# Patient Record
Sex: Male | Born: 1970 | Race: White | Hispanic: No | Marital: Married | Smoking: Never smoker
Health system: Southern US, Community
[De-identification: ages and names within clinical notes are randomized; demographics above are authoritative.]

## PROBLEM LIST (undated history)

## (undated) DIAGNOSIS — I10 Essential (primary) hypertension: Secondary | ICD-10-CM

## (undated) HISTORY — PX: NOSE SURGERY: SHX723

## (undated) HISTORY — PX: NASAL SEPTOPLASTY W/ TURBINOPLASTY: SHX2070

## (undated) HISTORY — PX: BACK SURGERY: SHX140

## (undated) HISTORY — PX: LUMBAR LAMINECTOMY: SHX95

## (undated) NOTE — *Deleted (*Deleted)
Initial Nutrition Assessment  DOCUMENTATION CODES:      INTERVENTION:     NUTRITION DIAGNOSIS:     related to   as evidenced by  .    GOAL:        MONITOR:      REASON FOR ASSESSMENT:   Malnutrition Screening Tool    ASSESSMENT:  5 year old male with history of DM2, HTN, CKD stage IIIa, diagnosed with Covid on 9/30 who presents with shortness of breath,     ***   NUTRITION - FOCUSED PHYSICAL EXAM:  {RD Focused Exam List:21252}  Diet Order:   Diet Order            Diet heart healthy/carb modified Room service appropriate? Yes; Fluid consistency: Thin  Diet effective now                 EDUCATION NEEDS:      Skin:     Last BM:     Height:   Ht Readings from Last 1 Encounters:  06/28/20 5\' 11"  (1.803 m)    Weight:   Wt Readings from Last 1 Encounters:  06/28/20 105 kg    Ideal Body Weight:     BMI:  Body mass index is 32.27 kg/m.  Estimated Nutritional Needs:   Kcal:     Protein:     Fluid:       ***

---

## 2007-01-01 ENCOUNTER — Ambulatory Visit: Payer: Self-pay | Admitting: Otolaryngology

## 2007-01-10 ENCOUNTER — Ambulatory Visit: Payer: Self-pay | Admitting: Otolaryngology

## 2007-05-07 ENCOUNTER — Emergency Department: Payer: Self-pay | Admitting: Emergency Medicine

## 2013-10-21 ENCOUNTER — Ambulatory Visit: Payer: Self-pay

## 2014-01-30 ENCOUNTER — Emergency Department: Payer: Self-pay | Admitting: Emergency Medicine

## 2014-01-30 LAB — URINALYSIS, COMPLETE
BILIRUBIN, UR: NEGATIVE
Bacteria: NONE SEEN
Glucose,UR: NEGATIVE mg/dL (ref 0–75)
KETONE: NEGATIVE
Leukocyte Esterase: NEGATIVE
Nitrite: NEGATIVE
Ph: 5 (ref 4.5–8.0)
Protein: 100
RBC,UR: 24 /HPF (ref 0–5)
SQUAMOUS EPITHELIAL: NONE SEEN
Specific Gravity: 1.027 (ref 1.003–1.030)
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
## 2015-11-17 ENCOUNTER — Other Ambulatory Visit: Payer: Self-pay | Admitting: Orthopedic Surgery

## 2015-11-17 DIAGNOSIS — M5441 Lumbago with sciatica, right side: Secondary | ICD-10-CM

## 2015-11-27 ENCOUNTER — Ambulatory Visit: Payer: Self-pay

## 2015-12-09 ENCOUNTER — Ambulatory Visit
Admission: RE | Admit: 2015-12-09 | Discharge: 2015-12-09 | Disposition: A | Discharge status: Home / Self Care | Payer: Managed Care, Other (non HMO) | Source: Ambulatory Visit | Attending: Orthopedic Surgery | Admitting: Orthopedic Surgery

## 2015-12-09 DIAGNOSIS — M5126 Other intervertebral disc displacement, lumbar region: Secondary | ICD-10-CM | POA: Diagnosis not present

## 2015-12-09 DIAGNOSIS — M2578 Osteophyte, vertebrae: Secondary | ICD-10-CM | POA: Insufficient documentation

## 2015-12-09 DIAGNOSIS — M4806 Spinal stenosis, lumbar region: Secondary | ICD-10-CM | POA: Insufficient documentation

## 2015-12-09 DIAGNOSIS — M5441 Lumbago with sciatica, right side: Secondary | ICD-10-CM | POA: Diagnosis not present

## 2015-12-09 DIAGNOSIS — M5137 Other intervertebral disc degeneration, lumbosacral region: Secondary | ICD-10-CM | POA: Insufficient documentation

## 2015-12-11 ENCOUNTER — Ambulatory Visit: Payer: Self-pay

## 2015-12-21 ENCOUNTER — Other Ambulatory Visit: Payer: Self-pay | Admitting: Orthopedic Surgery

## 2015-12-21 DIAGNOSIS — M5441 Lumbago with sciatica, right side: Secondary | ICD-10-CM

## 2015-12-21 DIAGNOSIS — M5126 Other intervertebral disc displacement, lumbar region: Secondary | ICD-10-CM

## 2015-12-23 ENCOUNTER — Ambulatory Visit
Admission: RE | Admit: 2015-12-23 | Discharge: 2015-12-23 | Disposition: A | Discharge status: Home / Self Care | Payer: Managed Care, Other (non HMO) | Source: Ambulatory Visit | Attending: Orthopedic Surgery | Admitting: Orthopedic Surgery

## 2015-12-23 ENCOUNTER — Other Ambulatory Visit: Payer: Self-pay | Admitting: Orthopedic Surgery

## 2015-12-23 ENCOUNTER — Other Ambulatory Visit: Payer: Self-pay

## 2015-12-23 DIAGNOSIS — M5126 Other intervertebral disc displacement, lumbar region: Secondary | ICD-10-CM

## 2015-12-23 DIAGNOSIS — I10 Essential (primary) hypertension: Secondary | ICD-10-CM | POA: Insufficient documentation

## 2015-12-23 DIAGNOSIS — J309 Allergic rhinitis, unspecified: Secondary | ICD-10-CM | POA: Insufficient documentation

## 2015-12-23 DIAGNOSIS — M5441 Lumbago with sciatica, right side: Secondary | ICD-10-CM

## 2015-12-23 MED ORDER — IOHEXOL 180 MG/ML  SOLN
1.0000 mL | Freq: Once | INTRAMUSCULAR | Status: AC | PRN
  Administered 2015-12-23: 1 mL via EPIDURAL

## 2015-12-23 MED ORDER — METHYLPREDNISOLONE ACETATE 40 MG/ML INJ SUSP (RADIOLOG
120.0000 mg | Freq: Once | INTRAMUSCULAR | Status: AC
  Administered 2015-12-23: 120 mg via EPIDURAL

## 2015-12-23 NOTE — Discharge Instructions (Signed)

## 2016-01-19 ENCOUNTER — Other Ambulatory Visit: Payer: Self-pay | Admitting: Orthopedic Surgery

## 2016-01-19 DIAGNOSIS — M545 Low back pain: Principal | ICD-10-CM

## 2016-01-19 DIAGNOSIS — G8929 Other chronic pain: Secondary | ICD-10-CM

## 2016-01-20 ENCOUNTER — Other Ambulatory Visit: Payer: Self-pay | Admitting: Orthopedic Surgery

## 2016-01-20 ENCOUNTER — Ambulatory Visit
Admission: RE | Admit: 2016-01-20 | Discharge: 2016-01-20 | Disposition: A | Discharge status: Home / Self Care | Payer: BLUE CROSS/BLUE SHIELD | Source: Ambulatory Visit | Attending: Orthopedic Surgery | Admitting: Orthopedic Surgery

## 2016-01-20 DIAGNOSIS — M545 Low back pain, unspecified: Secondary | ICD-10-CM

## 2016-01-20 DIAGNOSIS — G8929 Other chronic pain: Secondary | ICD-10-CM

## 2016-01-20 MED ORDER — IOPAMIDOL (ISOVUE-M 200) INJECTION 41%
1.0000 mL | Freq: Once | INTRAMUSCULAR | Status: AC
  Administered 2016-01-20: 1 mL via EPIDURAL

## 2016-01-20 MED ORDER — METHYLPREDNISOLONE ACETATE 40 MG/ML INJ SUSP (RADIOLOG
120.0000 mg | Freq: Once | INTRAMUSCULAR | Status: AC
  Administered 2016-01-20: 120 mg via EPIDURAL

## 2018-02-28 ENCOUNTER — Encounter: Payer: Self-pay | Admitting: *Deleted

## 2018-02-28 ENCOUNTER — Emergency Department
Admission: EM | Admit: 2018-02-28 | Discharge: 2018-02-28 | Disposition: A | Discharge status: Home / Self Care | Payer: BLUE CROSS/BLUE SHIELD | Attending: Emergency Medicine | Admitting: Emergency Medicine

## 2018-02-28 ENCOUNTER — Other Ambulatory Visit: Payer: Self-pay

## 2018-02-28 ENCOUNTER — Emergency Department: Payer: BLUE CROSS/BLUE SHIELD

## 2018-02-28 ENCOUNTER — Ambulatory Visit
Admission: EM | Admit: 2018-02-28 | Discharge: 2018-02-28 | Disposition: A | Discharge status: Other Acute Inpatient Hospital | Payer: BLUE CROSS/BLUE SHIELD | Source: Home / Self Care

## 2018-02-28 DIAGNOSIS — M5489 Other dorsalgia: Secondary | ICD-10-CM | POA: Diagnosis present

## 2018-02-28 DIAGNOSIS — I10 Essential (primary) hypertension: Secondary | ICD-10-CM | POA: Diagnosis not present

## 2018-02-28 DIAGNOSIS — R9431 Abnormal electrocardiogram [ECG] [EKG]: Secondary | ICD-10-CM | POA: Diagnosis not present

## 2018-02-28 DIAGNOSIS — R001 Bradycardia, unspecified: Secondary | ICD-10-CM | POA: Diagnosis not present

## 2018-02-28 DIAGNOSIS — Z79899 Other long term (current) drug therapy: Secondary | ICD-10-CM | POA: Diagnosis not present

## 2018-02-28 DIAGNOSIS — M549 Dorsalgia, unspecified: Secondary | ICD-10-CM | POA: Diagnosis not present

## 2018-02-28 DIAGNOSIS — M5413 Radiculopathy, cervicothoracic region: Secondary | ICD-10-CM | POA: Insufficient documentation

## 2018-02-28 MED ORDER — ASPIRIN 81 MG PO CHEW
324.0000 mg | CHEWABLE_TABLET | Freq: Once | ORAL | Status: AC
  Administered 2018-02-28: 324 mg via ORAL

## 2018-02-28 MED ORDER — NITROGLYCERIN 2 % TD OINT
1.0000 [in_us] | TOPICAL_OINTMENT | Freq: Once | TRANSDERMAL | Status: AC
  Administered 2018-02-28: 1 [in_us] via TOPICAL

## 2018-02-28 MED ORDER — PREDNISONE 20 MG PO TABS
60.0000 mg | ORAL_TABLET | Freq: Once | ORAL | Status: AC
  Administered 2018-02-28: 60 mg via ORAL
  Filled 2018-02-28: qty 3

## 2018-02-28 MED ORDER — CLONIDINE HCL 0.1 MG PO TABS
0.1000 mg | ORAL_TABLET | Freq: Once | ORAL | Status: AC
  Administered 2018-02-28: 0.1 mg via ORAL

## 2018-02-28 MED ORDER — PREDNISONE 20 MG PO TABS: 60.0000 mg | ORAL_TABLET | Freq: Every day | ORAL | 0 refills | Status: AC

## 2018-02-28 MED ORDER — ACETAMINOPHEN 500 MG PO TABS
1000.0000 mg | ORAL_TABLET | Freq: Once | ORAL | Status: AC
  Administered 2018-02-28: 1000 mg via ORAL
  Filled 2018-02-28: qty 2

## 2018-02-28 NOTE — ED Provider Notes (Signed)
[facility] Emergency Department Provider Note  ____________________________________________  Time seen: Approximately 6:18 PM  I have reviewed the triage vital signs and the nursing notes.   HISTORY  Chief Complaint Back Pain and Hypertension   HPI Miguel Blacksmitharon Eskew is a 47 y.o. male history of high blood pressure who presents for evaluation of back pain and right arm pain.  Patient reports that he woke up this morning with pain that starts on the right upper back and radiates down his right arm.  The pain is constant dull and nonradiating.  Moving his head up and down makes the pain worse, lifting his arm up makes the pain better.  He denies any trauma or lifting anything heavy yesterday.  Patient reports no weakness or numbness of his arm, no chest pain, no dizziness.  He reports that the pain initially was severe at this morning however throughout the day the pain improved.  Pain is currently 2 out of 10.  Patient also has a history of high blood pressure but has been taken off of his medications by his primary care doctor.  He reports that several physicians at the practice he used to go quit and he has not seen a doctor in 2 to 3 years.  Patient Active Problem List   Diagnosis Date Noted  . Allergic rhinitis 12/23/2015  . BP (high blood pressure) 12/23/2015    Past Surgical History:  Procedure Laterality Date  . LUMBAR LAMINECTOMY     multiple  . NOSE SURGERY      Prior to Admission medications   Medication Sig Start Date End Date Taking? Authorizing Provider  gabapentin (NEURONTIN) 300 MG capsule Take by mouth. 11/02/15 01/01/16  [provider]  predniSONE (DELTASONE) 20 MG tablet Take 3 tablets (60 mg total) by mouth daily for 4 days. 02/28/18 03/04/18  Nita SickleVeronese, [place], MD  tiZANidine (ZANAFLEX) 4 MG tablet Take by mouth. 10/26/15   [provider]  traMADol (ULTRAM) 50 MG tablet Take 50 mg by mouth every 6 (six) hours as needed.     [provider]    Allergies Patient has no known allergies.  FH Throat cancer Father    Diabetes type II Mother    Myocardial Infarction (Heart attack) Mother  age 55<60  Prostate cancer Paternal Uncle       Social History Social History   Tobacco Use  . Smoking status: Never Smoker  . Smokeless tobacco: Never Used  Substance Use Topics  . Alcohol use: Yes    Alcohol/week: 0.0 oz    Comment: rarely  . Drug use: Never    Review of Systems  Constitutional: Negative for fever. Eyes: Negative for visual changes. ENT: Negative for sore throat. Neck: No neck pain  Cardiovascular: Negative for chest pain. Respiratory: Negative for shortness of breath. Gastrointestinal: Negative for abdominal pain, vomiting or diarrhea. Genitourinary: Negative for dysuria. Musculoskeletal: + R upper back pain and R arm pain Skin: Negative for rash. Neurological: Negative for headaches, weakness or numbness. Psych: No SI or HI  ____________________________________________   PHYSICAL EXAM:  VITAL SIGNS: ED Triage Vitals  Enc Vitals Group     BP 02/28/18 1732 (!) 144/83     Pulse Rate 02/28/18 1732 68     Resp 02/28/18 1732 12     Temp 02/28/18 1732 97.8 F (36.6 C)     Temp Source 02/28/18 1732 Oral     SpO2 02/28/18 1732 100 %     Weight 02/28/18  1733 245 lb (111.1 kg)     Height 02/28/18 1733 5\' 11"  (1.803 m)     Head Circumference --      Peak Flow --      Pain Score 02/28/18 1733 8     Pain Loc --      Pain Edu? --      Excl. in GC? --     Constitutional: Alert and oriented. Well appearing and in no apparent distress. HEENT:      Head: Normocephalic and atraumatic.         Eyes: Conjunctivae are normal. Sclera is non-icteric.       Mouth/Throat: Mucous membranes are moist.       Neck: Supple with no signs of meningismus. Cardiovascular: Regular rate and rhythm. No murmurs, gallops, or rubs. 2+ symmetrical distal pulses are present in all extremities.  No JVD. Respiratory: Normal respiratory effort. Lungs are clear to auscultation bilaterally. No wheezes, crackles, or rhonchi.  Gastrointestinal: Soft, non tender, and non distended with positive bowel sounds. No rebound or guarding. Musculoskeletal: Nontender with normal range of motion in all extremities. No edema, cyanosis, or erythema of extremities. Pain is reproducible with palpation of the medial upper scapula on the R. No midline c/t/l spine tenderness Neurologic: Normal speech and language. Face is symmetric. Moving all extremities. No gross focal neurologic deficits are appreciated. Skin: Skin is warm, dry and intact. No rash noted. Psychiatric: Mood and affect are normal. Speech and behavior are normal.  ____________________________________________   LABS (all labs ordered are listed, but only abnormal results are displayed)  Labs Reviewed - No data to display ____________________________________________  EKG  ED ECG REPORT I, Nita Sickle, the attending physician, personally viewed and interpreted this ECG.  Sinus bradycardia, rate of 54, normal intervals, left axis deviation, no ST elevations or depressions, Q wave in inferior leads.  No prior for comparison. ____________________________________________  RADIOLOGY  I have personally reviewed the images performed during this visit and I agree with the Radiologist's read.   Interpretation by Radiologist:  Dg Thoracic Spine 2 View  Result Date: 02/28/2018 CLINICAL DATA:  Thoracic spine pain. EXAM: THORACIC SPINE 2 VIEWS COMPARISON:  None FINDINGS: There is no evidence of thoracic spine fracture. Mild multi level disc space narrowing and ventral endplate spurring identified. Alignment is normal. No other significant bone abnormalities are identified. IMPRESSION: 1. No acute findings. 2. Mild multilevel thoracic spondylosis noted. Electronically Signed   By: Signa Kell M.D.   On: 02/28/2018 18:07   Dg Scapula  Right  Result Date: 02/28/2018 CLINICAL DATA:  Initial evaluation for acute right upper back pain radiating into the right upper extremity. EXAM: RIGHT SCAPULA - 2+ VIEWS COMPARISON:  None. FINDINGS: No acute fracture or dislocation. Degenerative changes noted about the shoulder. Osseous mineralization normal. No soft tissue abnormality. Visualize right hemithorax clear. IMPRESSION: No acute osseous abnormality about the right scapula. Electronically Signed   By: Rise Mu M.D.   On: 02/28/2018 18:00   Dg Shoulder Right  Result Date: 02/28/2018 CLINICAL DATA:  Initial evaluation for mid back pain with extension into the right upper extremity. EXAM: RIGHT SHOULDER - 2+ VIEW COMPARISON:  None. FINDINGS: No acute fracture or dislocation. Humeral head in normal line with the glenoid. AC joint approximated. 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 No periarticular calcification. Visualize right hemithorax clear. IMPRESSION: 1. No acute osseous abnormality about the right shoulder. 2. 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 Electronically Signed  By: Rise Mu M.D.   On: 02/28/2018 18:02    ____________________________________________   PROCEDURES  Procedure(s) performed: None Procedures Critical Care performed:  None ____________________________________________   INITIAL IMPRESSION / ASSESSMENT AND PLAN / ED COURSE   47 y.o. male history of high blood pressure who presents for evaluation of back pain and right arm pain.  Presentation is consistent with radiculopathy with pain reproduced with movement of the neck .  X-ray of cervical spine, shoulder and scapula revealed DJD changes.  There is no CT and L-spine tenderness.  EKG with no ischemic changes.  Blood pressure here is elevated, but patient is asymptomatic, will refer to [facility] PCP for management. Will start on prednisone. Discussed return precautions with patient.        As part of my medical decision making, I reviewed the following data within the electronic MEDICAL RECORD NUMBER Nursing notes reviewed and incorporated, EKG interpreted , Radiograph reviewed , Notes from prior ED visits and [place] Controlled Substance Database    Pertinent labs & imaging results that were available during my care of the patient were reviewed by me and considered in my medical decision making (see chart for details).    ____________________________________________   FINAL CLINICAL IMPRESSION(S) / ED DIAGNOSES  Final diagnoses:  Radiculopathy of cervicothoracic region      NEW MEDICATIONS STARTED DURING THIS VISIT:  ED Discharge Orders        Ordered    predniSONE (DELTASONE) 20 MG tablet  Daily     02/28/18 1817       Note:  This document was prepared using Dragon voice recognition software and may include unintentional dictation errors.    Don Perking, [place], MD 02/28/18 [phone]

## 2018-02-28 NOTE — ED Triage Notes (Signed)
Patient complains of mid back pain in between his shoulder blades that radiates down his right arm through fingers x today. Reports that he had similar symptoms on the left side one week ago.

## 2018-02-28 NOTE — ED Triage Notes (Signed)
[facility] after originally reporting back pain for the past week that is not tender upon palpation of the back. NO reports of injury or trauma. Pain increases when moving head and neck. [facility] sent pt to ED after pt had elevated BP of 178/125. Pt reports intermittent HTN over the past couple years after having gained weight. Pt has gained 25lb recently.   UC gave pt 1 inch Nitro paste and .1 mg of Clonidine and EMS gave 1 Nitro spray.

## 2018-02-28 NOTE — ED Notes (Signed)
Family at bedside. Pt taken to Xray.

## 2018-02-28 NOTE — ED Notes (Signed)
Pt reports pain has increased. PT currently rolling in bed reporting the pain is unmanageable.

## 2018-02-28 NOTE — ED Provider Notes (Signed)
[facility]    CSN: [id number] Arrival date & time: 02/28/18  1548     History   Chief Complaint Chief Complaint  Patient presents with  . Back Pain    HPI Miguel Costa is a 47 y.o. male.   47 yo male with a c/o severe (8-9/10) mid/upper back pain waxing and waning since this morning, associated with radiation to his arm (right greater). Denies any chest pains, shortness of breath, diaphoresis, nausea, vomiting. Also denies any injuries. Has a h/o hypertension that has not been treated for years and a family history of heart disease. Does not take any prescription medications.   The history is provided by the patient.    History reviewed. No pertinent past medical history.  Patient Active Problem List   Diagnosis Date Noted  . Allergic rhinitis 12/23/2015  . BP (high blood pressure) 12/23/2015    Past Surgical History:  Procedure Laterality Date  . LUMBAR LAMINECTOMY     multiple  . NOSE SURGERY         Home Medications    Prior to Admission medications   Medication Sig Start Date End Date Taking? Authorizing Provider  gabapentin (NEURONTIN) 300 MG capsule Take by mouth. 11/02/15 01/01/16  [provider]  predniSONE (DELTASONE) 20 MG tablet Take 3 tablets (60 mg total) by mouth daily for 4 days. 02/28/18 03/04/18  Nita Sickle, MD  tiZANidine (ZANAFLEX) 4 MG tablet Take by mouth. 10/26/15   [provider]  traMADol (ULTRAM) 50 MG tablet Take 50 mg by mouth every 6 (six) hours as needed.    [provider]    Family History History reviewed. No pertinent family history.  Social History Social History   Tobacco Use  . Smoking status: Never Smoker  . Smokeless tobacco: Never Used  Substance Use Topics  . Alcohol use: Yes    Alcohol/week: 0.0 oz    Comment: rarely  . Drug use: Never     Allergies   Patient has no known allergies.   Review of Systems Review of Systems   Physical Exam Triage Vital  Signs ED Triage Vitals  Enc Vitals Group     BP 02/28/18 1559 (!) 187/127     Pulse Rate 02/28/18 1559 79     Resp 02/28/18 1559 18     Temp 02/28/18 1559 97.7 F (36.5 C)     Temp Source 02/28/18 1559 Oral     SpO2 02/28/18 1559 99 %     Weight 02/28/18 1557 245 lb (111.1 kg)     Height 02/28/18 1557 5\' 11"  (1.803 m)     Head Circumference --      Peak Flow --      Pain Score 02/28/18 1557 10     Pain Loc --      Pain Edu? --      Excl. in GC? --    No data found.  Updated Vital Signs BP (!) 178/125 (BP Location: Left Arm)   Pulse 79   Temp 97.7 F (36.5 C) (Oral)   Resp 18   Ht 5\' 11"  (1.803 m)   Wt 245 lb (111.1 kg)   SpO2 99%   BMI 34.17 kg/m   Visual Acuity Right Eye Distance:   Left Eye Distance:   Bilateral Distance:    Right Eye Near:   Left Eye Near:    Bilateral Near:     Physical Exam  Constitutional: He appears well-developed and well-nourished. No distress.  Cardiovascular: Normal rate, regular rhythm and normal heart sounds.  Pulmonary/Chest: Effort normal and breath sounds normal. No stridor. No respiratory distress. He has no wheezes. He has no rales.  Musculoskeletal: He exhibits no edema or tenderness.  Patient's back pain not reproducible to palpation on exam  Skin: He is not diaphoretic.  Nursing note and vitals reviewed.    UC Treatments / Results  Labs (all labs ordered are listed, but only abnormal results are displayed) Labs Reviewed - No data to display  EKG None  Radiology Dg Thoracic Spine 2 View  Result Date: 02/28/2018 CLINICAL DATA:  Thoracic spine pain. EXAM: THORACIC SPINE 2 VIEWS COMPARISON:  None FINDINGS: There is no evidence of thoracic spine fracture. Mild multi level disc space narrowing and ventral endplate spurring identified. Alignment is normal. No other significant bone abnormalities are identified. IMPRESSION: 1. No acute findings. 2. Mild multilevel thoracic spondylosis noted. Electronically Signed   By:  Signa Kellaylor  [place] M.D.   On: 02/28/2018 18:07   Dg Scapula Right  Result Date: 02/28/2018 CLINICAL DATA:  Initial evaluation for acute right upper back pain radiating into the right upper extremity. EXAM: RIGHT SCAPULA - 2+ VIEWS COMPARISON:  None. FINDINGS: No acute fracture or dislocation. Degenerative changes noted about the shoulder. Osseous mineralization normal. No soft tissue abnormality. Visualize right hemithorax clear. IMPRESSION: No acute osseous abnormality about the right scapula. Electronically Signed   By: Rise MuBenjamin  McClintock M.D.   On: 02/28/2018 18:00   Dg Shoulder Right  Result Date: 02/28/2018 CLINICAL DATA:  Initial evaluation for mid back pain with extension into the right upper extremity. EXAM: RIGHT SHOULDER - 2+ VIEW COMPARISON:  None. FINDINGS: No acute fracture or dislocation. Humeral head in normal line with the glenoid. AC joint approximated. 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 No periarticular calcification. Visualize right hemithorax clear. IMPRESSION: 1. No acute osseous abnormality about the right shoulder. 2. 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 Electronically Signed   By: Rise MuBenjamin  McClintock M.D.   On: 02/28/2018 18:02    Procedures Procedures (including critical care time)  Medications Ordered in UC Medications  cloNIDine (CATAPRES) tablet 0.1 mg (0.1 mg Oral Given 02/28/18 1606)  aspirin chewable tablet 324 mg (324 mg Oral Given 02/28/18 1637)  nitroGLYCERIN (NITROGLYN) 2 % ointment 1 inch (1 inch Topical Given 02/28/18 1647)    Initial Impression / Assessment and Plan / UC Course  I have reviewed the triage vital signs and the nursing notes.  Pertinent labs & imaging results that were available during my care of the patient were reviewed by me and considered in my medical decision making (see chart for details).      Final Clinical Impressions(s) / UC Diagnoses   Final diagnoses:  Severe back pain   Acute mid back pain  Severe uncontrolled hypertension  Nonspecific abnormal electrocardiogram (ECG) (EKG)  Bradycardia    ED Prescriptions    None     1. ekg results and possible diagnoses reviewed with patient; due to cardiovascular risk factors and current symptoms and findings, recommend patient go to ED for further evaluation and management. Patient given clonidine 0.1mg  po once, 4 baby ASA, 1/2 nitropaste. Patient in stable condition. [facility].   Controlled Substance Prescriptions  Controlled Substance Registry consulted? Not Applicable   Payton Mccallumonty, Luchiano Viscomi, MD 02/28/18 1900

## 2018-03-08 ENCOUNTER — Other Ambulatory Visit: Payer: Self-pay | Admitting: Unknown Physician Specialty

## 2018-03-08 DIAGNOSIS — M5412 Radiculopathy, cervical region: Secondary | ICD-10-CM

## 2018-03-15 ENCOUNTER — Other Ambulatory Visit: Payer: Self-pay | Admitting: Orthopedic Surgery

## 2018-03-15 DIAGNOSIS — M5412 Radiculopathy, cervical region: Secondary | ICD-10-CM

## 2018-03-23 ENCOUNTER — Ambulatory Visit
Admission: RE | Admit: 2018-03-23 | Discharge: 2018-03-23 | Disposition: A | Discharge status: Home / Self Care | Payer: BLUE CROSS/BLUE SHIELD | Source: Ambulatory Visit | Attending: Orthopedic Surgery | Admitting: Orthopedic Surgery

## 2018-03-23 DIAGNOSIS — M5412 Radiculopathy, cervical region: Secondary | ICD-10-CM

## 2018-03-23 MED ORDER — IOPAMIDOL (ISOVUE-M 300) INJECTION 61%
1.0000 mL | Freq: Once | INTRAMUSCULAR | Status: AC | PRN
  Administered 2018-03-23: 1 mL via EPIDURAL

## 2018-03-23 MED ORDER — TRIAMCINOLONE ACETONIDE 40 MG/ML IJ SUSP (RADIOLOGY)
60.0000 mg | Freq: Once | INTRAMUSCULAR | Status: AC
  Administered 2018-03-23: 60 mg via EPIDURAL

## 2018-03-23 NOTE — Discharge Instructions (Signed)

## 2019-04-10 ENCOUNTER — Emergency Department
Admission: EM | Admit: 2019-04-10 | Discharge: 2019-04-10 | Disposition: A | Discharge status: Other Acute Inpatient Hospital | Payer: BLUE CROSS/BLUE SHIELD | Attending: Emergency Medicine | Admitting: Emergency Medicine

## 2019-04-10 ENCOUNTER — Other Ambulatory Visit: Payer: Self-pay

## 2019-04-10 ENCOUNTER — Emergency Department: Payer: BLUE CROSS/BLUE SHIELD

## 2019-04-10 DIAGNOSIS — S36039A Unspecified laceration of spleen, initial encounter: Secondary | ICD-10-CM

## 2019-04-10 DIAGNOSIS — Y9389 Activity, other specified: Secondary | ICD-10-CM | POA: Insufficient documentation

## 2019-04-10 DIAGNOSIS — K661 Hemoperitoneum: Secondary | ICD-10-CM | POA: Diagnosis not present

## 2019-04-10 DIAGNOSIS — Y92410 Unspecified street and highway as the place of occurrence of the external cause: Secondary | ICD-10-CM | POA: Diagnosis not present

## 2019-04-10 DIAGNOSIS — Z79899 Other long term (current) drug therapy: Secondary | ICD-10-CM | POA: Insufficient documentation

## 2019-04-10 DIAGNOSIS — S2242XA Multiple fractures of ribs, left side, initial encounter for closed fracture: Secondary | ICD-10-CM | POA: Diagnosis not present

## 2019-04-10 DIAGNOSIS — I10 Essential (primary) hypertension: Secondary | ICD-10-CM | POA: Diagnosis not present

## 2019-04-10 DIAGNOSIS — Y999 Unspecified external cause status: Secondary | ICD-10-CM | POA: Diagnosis not present

## 2019-04-10 DIAGNOSIS — S3991XA Unspecified injury of abdomen, initial encounter: Secondary | ICD-10-CM | POA: Diagnosis present

## 2019-04-10 DIAGNOSIS — S36899A Unspecified injury of other intra-abdominal organs, initial encounter: Secondary | ICD-10-CM

## 2019-04-10 HISTORY — DX: Essential (primary) hypertension: I10

## 2019-04-10 LAB — COMPREHENSIVE METABOLIC PANEL
ALT: 24 U/L (ref 0–44)
AST: 26 U/L (ref 15–41)
Albumin: 4.4 g/dL (ref 3.5–5.0)
Alkaline Phosphatase: 58 U/L (ref 38–126)
Anion gap: 11 (ref 5–15)
BUN: 28 mg/dL — ABNORMAL HIGH (ref 6–20)
CO2: 22 mmol/L (ref 22–32)
Calcium: 8.9 mg/dL (ref 8.9–10.3)
Chloride: 106 mmol/L (ref 98–111)
Creatinine, Ser: 1.6 mg/dL — ABNORMAL HIGH (ref 0.61–1.24)
GFR calc Af Amer: 59 mL/min — ABNORMAL LOW (ref >60)
GFR calc non Af Amer: 51 mL/min — ABNORMAL LOW (ref >60)
Glucose, Bld: 173 mg/dL — ABNORMAL HIGH (ref 70–99)
Potassium: 3.8 mmol/L (ref 3.5–5.1)
Sodium: 139 mmol/L (ref 135–145)
Total Bilirubin: 2.1 mg/dL — ABNORMAL HIGH (ref 0.3–1.2)
Total Protein: 7.3 g/dL (ref 6.5–8.1)

## 2019-04-10 LAB — CBC
HCT: 44 % (ref 39.0–52.0)
Hemoglobin: 14.8 g/dL (ref 13.0–17.0)
MCH: 30.7 pg (ref 26.0–34.0)
MCHC: 33.6 g/dL (ref 30.0–36.0)
MCV: 91.3 fL (ref 80.0–100.0)
Platelets: 253 10*3/uL (ref 150–400)
RBC: 4.82 MIL/uL (ref 4.22–5.81)
RDW: 12.3 % (ref 11.5–15.5)
WBC: 12.9 10*3/uL — ABNORMAL HIGH (ref 4.0–10.5)
nRBC: 0 % (ref 0.0–0.2)

## 2019-04-10 MED ORDER — MORPHINE SULFATE (PF) 4 MG/ML IV SOLN
4.0000 mg | Freq: Once | INTRAVENOUS | Status: AC
  Administered 2019-04-10: 4 mg via INTRAVENOUS
  Filled 2019-04-10: qty 1

## 2019-04-10 MED ORDER — HYDROMORPHONE HCL 1 MG/ML IJ SOLN
1.0000 mg | INTRAMUSCULAR | Status: AC
  Administered 2019-04-10: 11:00:00 1 mg via INTRAVENOUS
  Filled 2019-04-10: qty 1

## 2019-04-10 MED ORDER — SODIUM CHLORIDE 0.9 % IV BOLUS
1000.0000 mL | Freq: Once | INTRAVENOUS | Status: AC
  Administered 2019-04-10: 07:00:00 1000 mL via INTRAVENOUS

## 2019-04-10 MED ORDER — ONDANSETRON HCL 4 MG/2ML IJ SOLN
INTRAMUSCULAR | Status: AC
  Administered 2019-04-10: 4 mg via INTRAVENOUS
  Filled 2019-04-10: qty 2

## 2019-04-10 MED ORDER — MORPHINE SULFATE (PF) 4 MG/ML IV SOLN
INTRAVENOUS | Status: AC
  Administered 2019-04-10: 4 mg via INTRAVENOUS
  Filled 2019-04-10: qty 1

## 2019-04-10 MED ORDER — IOHEXOL 300 MG/ML  SOLN
60.0000 mL | Freq: Once | INTRAMUSCULAR | Status: AC | PRN
  Administered 2019-04-10: 08:00:00 60 mL via INTRAVENOUS

## 2019-04-10 MED ORDER — HYDROMORPHONE HCL 1 MG/ML IJ SOLN
1.0000 mg | INTRAMUSCULAR | Status: AC
  Administered 2019-04-10: 1 mg via INTRAVENOUS
  Filled 2019-04-10: qty 1

## 2019-04-10 MED ORDER — IOHEXOL 300 MG/ML  SOLN
75.0000 mL | Freq: Once | INTRAMUSCULAR | Status: AC | PRN
  Administered 2019-04-10: 08:00:00 75 mL via INTRAVENOUS

## 2019-04-10 MED ORDER — ONDANSETRON HCL 4 MG/2ML IJ SOLN
4.0000 mg | Freq: Once | INTRAMUSCULAR | Status: AC
  Administered 2019-04-10: 06:00:00 4 mg via INTRAVENOUS

## 2019-04-10 MED ORDER — MORPHINE SULFATE (PF) 4 MG/ML IV SOLN
4.0000 mg | Freq: Once | INTRAVENOUS | Status: AC
  Administered 2019-04-10: 06:00:00 4 mg via INTRAVENOUS
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
## 2019-04-10 NOTE — ED Notes (Signed)
Pt placed on 2L O2 via [place] at this time d/t O2 sats in the high 80's; pt unable to take a deep breath d/t pain.

## 2019-04-10 NOTE — ED Notes (Signed)
EMTALA reviewed by charge RN 

## 2019-04-10 NOTE — ED Notes (Signed)
Answered pts call bell. Pt stating that he needs to urinate. Pt refusing to move and stating he will urinate on self. This tech got Miguel Costa to assist but pt still refusing assistance. Offered multiple different ways to assist pt to use urinal without much movement but pt still refusing. Informed pt to let us know when he was ready and we would help.

## 2019-04-10 NOTE — ED Notes (Signed)
[facility]  CALLED  PER  DR  STAFFORD MD 
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
## 2019-04-10 NOTE — ED Notes (Signed)
[place]
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
## 2019-07-04 ENCOUNTER — Other Ambulatory Visit: Payer: Self-pay | Admitting: Orthopedic Surgery

## 2019-07-04 DIAGNOSIS — M5412 Radiculopathy, cervical region: Secondary | ICD-10-CM

## 2019-07-05 ENCOUNTER — Other Ambulatory Visit: Payer: Self-pay

## 2019-07-05 ENCOUNTER — Ambulatory Visit
Admission: RE | Admit: 2019-07-05 | Discharge: 2019-07-05 | Disposition: A | Discharge status: Home / Self Care | Payer: BLUE CROSS/BLUE SHIELD | Source: Ambulatory Visit | Attending: Orthopedic Surgery | Admitting: Orthopedic Surgery

## 2019-07-05 DIAGNOSIS — M5412 Radiculopathy, cervical region: Secondary | ICD-10-CM

## 2019-07-05 MED ORDER — TRIAMCINOLONE ACETONIDE 40 MG/ML IJ SUSP (RADIOLOGY)
60.0000 mg | Freq: Once | INTRAMUSCULAR | Status: AC
  Administered 2019-07-05: 60 mg via EPIDURAL

## 2019-07-05 MED ORDER — IOPAMIDOL (ISOVUE-M 300) INJECTION 61%
1.0000 mL | Freq: Once | INTRAMUSCULAR | Status: AC | PRN
  Administered 2019-07-05: 1 mL via EPIDURAL

## 2019-07-05 NOTE — Discharge Instructions (Signed)

## 2019-07-26 ENCOUNTER — Other Ambulatory Visit: Payer: Self-pay | Admitting: Orthopedic Surgery

## 2019-07-26 DIAGNOSIS — M5412 Radiculopathy, cervical region: Secondary | ICD-10-CM

## 2020-01-07 ENCOUNTER — Encounter: Payer: Self-pay | Admitting: Emergency Medicine

## 2020-01-07 ENCOUNTER — Emergency Department
Admission: EM | Admit: 2020-01-07 | Discharge: 2020-01-07 | Disposition: A | Discharge status: Home / Self Care | Payer: 59 | Attending: Emergency Medicine | Admitting: Emergency Medicine

## 2020-01-07 ENCOUNTER — Other Ambulatory Visit: Payer: Self-pay

## 2020-01-07 DIAGNOSIS — Z79899 Other long term (current) drug therapy: Secondary | ICD-10-CM | POA: Diagnosis not present

## 2020-01-07 DIAGNOSIS — R739 Hyperglycemia, unspecified: Secondary | ICD-10-CM

## 2020-01-07 DIAGNOSIS — I10 Essential (primary) hypertension: Secondary | ICD-10-CM | POA: Insufficient documentation

## 2020-01-07 DIAGNOSIS — E119 Type 2 diabetes mellitus without complications: Secondary | ICD-10-CM | POA: Diagnosis not present

## 2020-01-07 DIAGNOSIS — E139 Other specified diabetes mellitus without complications: Secondary | ICD-10-CM

## 2020-01-07 LAB — CBC WITH DIFFERENTIAL/PLATELET
Abs Immature Granulocytes: 0.01 10*3/uL (ref 0.00–0.07)
Basophils Absolute: 0 10*3/uL (ref 0.0–0.1)
Basophils Relative: 0 %
Eosinophils Absolute: 0.1 10*3/uL (ref 0.0–0.5)
Eosinophils Relative: 2 %
HCT: 40.6 % (ref 39.0–52.0)
Hemoglobin: 14.8 g/dL (ref 13.0–17.0)
Immature Granulocytes: 0 %
Lymphocytes Relative: 31 %
Lymphs Abs: 2 10*3/uL (ref 0.7–4.0)
MCH: 31.5 pg (ref 26.0–34.0)
MCHC: 36.5 g/dL — ABNORMAL HIGH (ref 30.0–36.0)
MCV: 86.4 fL (ref 80.0–100.0)
Monocytes Absolute: 0.4 10*3/uL (ref 0.1–1.0)
Monocytes Relative: 7 %
Neutro Abs: 3.8 10*3/uL (ref 1.7–7.7)
Neutrophils Relative %: 60 %
Platelets: 194 10*3/uL (ref 150–400)
RBC: 4.7 MIL/uL (ref 4.22–5.81)
RDW: 11.8 % (ref 11.5–15.5)
WBC: 6.4 10*3/uL (ref 4.0–10.5)
nRBC: 0 % (ref 0.0–0.2)

## 2020-01-07 LAB — COMPREHENSIVE METABOLIC PANEL
ALT: 36 U/L (ref 0–44)
AST: 23 U/L (ref 15–41)
Albumin: 4 g/dL (ref 3.5–5.0)
Alkaline Phosphatase: 89 U/L (ref 38–126)
Anion gap: 7 (ref 5–15)
BUN: 37 mg/dL — ABNORMAL HIGH (ref 6–20)
CO2: 24 mmol/L (ref 22–32)
Calcium: 8.9 mg/dL (ref 8.9–10.3)
Chloride: 97 mmol/L — ABNORMAL LOW (ref 98–111)
Creatinine, Ser: 1.96 mg/dL — ABNORMAL HIGH (ref 0.61–1.24)
GFR calc Af Amer: 46 mL/min — ABNORMAL LOW (ref >60)
GFR calc non Af Amer: 39 mL/min — ABNORMAL LOW (ref >60)
Glucose, Bld: 730 mg/dL (ref 70–99)
Potassium: 4.8 mmol/L (ref 3.5–5.1)
Sodium: 128 mmol/L — ABNORMAL LOW (ref 135–145)
Total Bilirubin: 1.5 mg/dL — ABNORMAL HIGH (ref 0.3–1.2)
Total Protein: 7.1 g/dL (ref 6.5–8.1)

## 2020-01-07 LAB — URINALYSIS, COMPLETE (UACMP) WITH MICROSCOPIC
Bacteria, UA: NONE SEEN
Bilirubin Urine: NEGATIVE
Glucose, UA: >=500 mg/dL — AB
Ketones, ur: NEGATIVE mg/dL
Leukocytes,Ua: NEGATIVE
Nitrite: NEGATIVE
Protein, ur: NEGATIVE mg/dL
Specific Gravity, Urine: 1.024 (ref 1.005–1.030)
Squamous Epithelial / HPF: NONE SEEN (ref 0–5)
pH: 6 (ref 5.0–8.0)

## 2020-01-07 LAB — GLUCOSE, CAPILLARY
Glucose-Capillary: >600 mg/dL (ref 70–99)
Glucose-Capillary: 560 mg/dL (ref 70–99)
Glucose-Capillary: 387 mg/dL — ABNORMAL HIGH (ref 70–99)
Glucose-Capillary: 522 mg/dL (ref 70–99)

## 2020-01-07 MED ORDER — INSULIN ASPART 100 UNIT/ML ~~LOC~~ SOLN
10.0000 [IU] | Freq: Once | SUBCUTANEOUS | Status: AC
  Administered 2020-01-07: 10 [IU] via INTRAVENOUS
  Filled 2020-01-07: qty 1

## 2020-01-07 MED ORDER — SODIUM CHLORIDE 0.9 % IV BOLUS
1000.0000 mL | Freq: Once | INTRAVENOUS | Status: AC
  Administered 2020-01-07: 22:00:00 1000 mL via INTRAVENOUS

## 2020-01-07 MED ORDER — METFORMIN HCL 500 MG PO TABS: 500.0000 mg | ORAL_TABLET | Freq: Two times a day (BID) | ORAL | 1 refills | Status: DC

## 2020-01-07 MED ORDER — SODIUM CHLORIDE 0.9 % IV BOLUS
1000.0000 mL | Freq: Once | INTRAVENOUS | Status: AC
  Administered 2020-01-07: 1000 mL via INTRAVENOUS

## 2020-01-07 MED ORDER — LACTATED RINGERS IV BOLUS
1000.0000 mL | Freq: Once | INTRAVENOUS | Status: AC
  Administered 2020-01-07: 1000 mL via INTRAVENOUS

## 2020-01-07 MED ORDER — IBUPROFEN 600 MG PO TABS
600.0000 mg | ORAL_TABLET | Freq: Once | ORAL | Status: AC
  Administered 2020-01-07: 600 mg via ORAL
  Filled 2020-01-07: qty 1

## 2020-01-07 NOTE — Discharge Instructions (Signed)
Please seek medical attention for any high fevers, chest pain, shortness of breath, change in behavior, persistent vomiting, bloody stool or any other new or concerning symptoms.  

## 2020-01-07 NOTE — ED Notes (Signed)
Wife at bedside.

## 2020-01-07 NOTE — ED Provider Notes (Signed)
[facility] Emergency Department Provider Note  ____________________________________________   I have reviewed the triage vital signs and the nursing notes.   HISTORY  Chief Complaint Hyperglycemia   History limited by: Not Limited   HPI Miguel Costa is a 49 y.o. male who presents to the emergency department today at the request of primary care doctor's office because of concerns for high blood sugar.  The patient went to his doctor's office because he has noticed over the past few weeks he has had increased thirst and urination.  Additionally for the past 2 weeks he has noticed increased blurry vision.  Patient states he has a history of prediabetes.  He has multiple family members with diabetes.  He himself however has never been diagnosed with diabetes.  Records reviewed. Per medical record review patient has a history of HTN.  Past Medical History:  Diagnosis Date  . Hypertension     Patient Active Problem List   Diagnosis Date Noted  . Allergic rhinitis 12/23/2015  . BP (high blood pressure) 12/23/2015    Past Surgical History:  Procedure Laterality Date  . LUMBAR LAMINECTOMY     multiple  . NOSE SURGERY      Prior to Admission medications   Medication Sig Start Date End Date Taking? Authorizing Provider  ibuprofen (ADVIL) 200 MG tablet Take 200-400 mg by mouth every 6 (six) hours as needed for fever or mild pain.     [provider]  valsartan (DIOVAN) 160 MG tablet Take 160 mg by mouth daily.  03/14/18 04/10/19  [provider]    Allergies Patient has no known allergies.  No family history on file.  Social History Social History   Tobacco Use  . Smoking status: Never Smoker  . Smokeless tobacco: Never Used  Substance Use Topics  . Alcohol use: Yes    Alcohol/week: 0.0 standard drinks    Comment: rarely  . Drug use: Never    Review of Systems Constitutional: No fever/chills Eyes: Positive for blurry  vision. ENT: No sore throat. Cardiovascular: Denies chest pain. Respiratory: Denies shortness of breath. Gastrointestinal: No abdominal pain.  No nausea, no vomiting.  No diarrhea.   Genitourinary: Positive for increased urination. Musculoskeletal: Negative for back pain. Skin: Negative for rash. Neurological: Negative for headaches, focal weakness or numbness.  ____________________________________________   PHYSICAL EXAM:  VITAL SIGNS: ED Triage Vitals [01/07/20 1913]  Enc Vitals Group     BP 133/80     Pulse Rate 97     Resp 18     Temp 98 F (36.7 C)     Temp Source Oral     SpO2 95 %     Weight 254 lb (115.2 kg)     Height 5\' 11"  (1.803 m)     Head Circumference      Peak Flow      Pain Score 0   Constitutional: Alert and oriented.  Eyes: Conjunctivae are normal.  ENT      Head: Normocephalic and atraumatic.      Nose: No congestion/rhinnorhea.      Mouth/Throat: Mucous membranes are moist.      Neck: No stridor. Hematological/Lymphatic/Immunilogical: No cervical lymphadenopathy. Cardiovascular: Normal rate, regular rhythm.  No murmurs, rubs, or gallops.  Respiratory: Normal respiratory effort without tachypnea nor retractions. Breath sounds are clear and equal bilaterally. No wheezes/rales/rhonchi. Gastrointestinal: Soft and non tender. No rebound. No guarding.  Genitourinary: Deferred Musculoskeletal: Normal range of motion in all extremities. No lower  extremity edema. Neurologic:  Normal speech and language. No gross focal neurologic deficits are appreciated.  Skin:  Skin is warm, dry and intact. No rash noted. Psychiatric: Mood and affect are normal. Speech and behavior are normal. Patient exhibits appropriate insight and judgment.  ____________________________________________    LABS (pertinent positives/negatives)  CMP na 128, k 4.8, glu 730, cr 1.96 CBC wbc 6.4, hgb 14.8, plt 194 UA clear, glucose >  500  ____________________________________________   EKG  None  ____________________________________________    RADIOLOGY  None  ____________________________________________   PROCEDURES  Procedures  ____________________________________________   INITIAL IMPRESSION / ASSESSMENT AND PLAN / ED COURSE  Pertinent labs & imaging results that were available during my care of the patient were reviewed by me and considered in my medical decision making (see chart for details).   Patient presented to the emergency department at the request of primary care provider's office because of concerns for hyperglycemia.  Patient's blood sugar here is significantly elevated.  No anion gap to suggest DKA.  Plan will be to give patient IV fluids and insulin if needed to try to bring his sugars down.  If we can get his sugars down to a reasonable level I do think it be reasonable to discharge patient.  I discussed with patient the diagnosis of diabetes.  Will prescribe Metformin.  ____________________________________________   FINAL CLINICAL IMPRESSION(S) / ED DIAGNOSES  Final diagnoses:  Hyperglycemia  Other specified diabetes mellitus without complication, without long-term current use of insulin (HCC)     Note: This dictation was prepared with Dragon dictation. Any transcriptional errors that result from this process are unintentional     Phineas Semen, MD 01/07/20 2251

## 2020-01-07 NOTE — ED Triage Notes (Signed)
Patient ambulatory to triage with steady gait, without difficulty or distress noted, mask in place; pt reports increased thirst and seen at PCP today; was called and told to go to ER for elevated glucose; denies any symptoms and denies any hx diabetes

## 2020-06-26 ENCOUNTER — Telehealth (HOSPITAL_COMMUNITY): Payer: Self-pay | Admitting: Nurse Practitioner

## 2020-06-26 DIAGNOSIS — U071 COVID-19: Secondary | ICD-10-CM

## 2020-06-26 NOTE — Telephone Encounter (Signed)
Called to discuss with Miguel Costa about Covid symptoms and the use of regeneron, a monoclonal antibody infusion for those with mild to moderate Covid symptoms and at a high risk of hospitalization.    Pt does not qualify for infusion therapy given symptoms first presented > 10 days prior to timing of infusion. Onset around 06/13/20. Symptoms tier reviewed as well as criteria for ending isolation. Preventative practices reviewed. Patient verbalized understanding  Patient Active Problem List   Diagnosis Date Noted  . Allergic rhinitis 12/23/2015  . BP (high blood pressure) 12/23/2015   Consuello Masse, NP [phone] [email or web address]

## 2020-06-27 ENCOUNTER — Emergency Department: Payer: 59

## 2020-06-27 ENCOUNTER — Encounter: Payer: Self-pay | Admitting: Emergency Medicine

## 2020-06-27 ENCOUNTER — Other Ambulatory Visit: Payer: Self-pay

## 2020-06-27 ENCOUNTER — Inpatient Hospital Stay
Admission: EM | Admit: 2020-06-27 | Discharge: 2020-07-01 | DRG: 177 | Disposition: A | Discharge status: Home / Self Care | Payer: 59 | Attending: Family Medicine | Admitting: Family Medicine

## 2020-06-27 DIAGNOSIS — N1831 Chronic kidney disease, stage 3a: Secondary | ICD-10-CM | POA: Diagnosis present

## 2020-06-27 DIAGNOSIS — E119 Type 2 diabetes mellitus without complications: Secondary | ICD-10-CM | POA: Diagnosis not present

## 2020-06-27 DIAGNOSIS — J9601 Acute respiratory failure with hypoxia: Secondary | ICD-10-CM | POA: Diagnosis present

## 2020-06-27 DIAGNOSIS — A0839 Other viral enteritis: Secondary | ICD-10-CM | POA: Diagnosis present

## 2020-06-27 DIAGNOSIS — I10 Essential (primary) hypertension: Secondary | ICD-10-CM

## 2020-06-27 DIAGNOSIS — I129 Hypertensive chronic kidney disease with stage 1 through stage 4 chronic kidney disease, or unspecified chronic kidney disease: Secondary | ICD-10-CM | POA: Diagnosis present

## 2020-06-27 DIAGNOSIS — Z23 Encounter for immunization: Secondary | ICD-10-CM | POA: Diagnosis not present

## 2020-06-27 DIAGNOSIS — J9600 Acute respiratory failure, unspecified whether with hypoxia or hypercapnia: Secondary | ICD-10-CM

## 2020-06-27 DIAGNOSIS — Z6832 Body mass index (BMI) 32.0-32.9, adult: Secondary | ICD-10-CM | POA: Diagnosis not present

## 2020-06-27 DIAGNOSIS — U071 COVID-19: Secondary | ICD-10-CM | POA: Diagnosis present

## 2020-06-27 DIAGNOSIS — E1165 Type 2 diabetes mellitus with hyperglycemia: Secondary | ICD-10-CM | POA: Diagnosis present

## 2020-06-27 DIAGNOSIS — J1282 Pneumonia due to coronavirus disease 2019: Secondary | ICD-10-CM | POA: Diagnosis present

## 2020-06-27 DIAGNOSIS — E669 Obesity, unspecified: Secondary | ICD-10-CM | POA: Diagnosis present

## 2020-06-27 DIAGNOSIS — E1122 Type 2 diabetes mellitus with diabetic chronic kidney disease: Secondary | ICD-10-CM | POA: Diagnosis present

## 2020-06-27 DIAGNOSIS — R0902 Hypoxemia: Secondary | ICD-10-CM

## 2020-06-27 LAB — BASIC METABOLIC PANEL
Anion gap: 10 (ref 5–15)
BUN: 20 mg/dL (ref 6–20)
CO2: 22 mmol/L (ref 22–32)
Calcium: 8.8 mg/dL — ABNORMAL LOW (ref 8.9–10.3)
Chloride: 102 mmol/L (ref 98–111)
Creatinine, Ser: 1.44 mg/dL — ABNORMAL HIGH (ref 0.61–1.24)
GFR, Estimated: 57 mL/min — ABNORMAL LOW (ref >60)
Glucose, Bld: 201 mg/dL — ABNORMAL HIGH (ref 70–99)
Potassium: 4.2 mmol/L (ref 3.5–5.1)
Sodium: 134 mmol/L — ABNORMAL LOW (ref 135–145)

## 2020-06-27 LAB — CBC
HCT: 46.6 % (ref 39.0–52.0)
Hemoglobin: 16.3 g/dL (ref 13.0–17.0)
MCH: 30.6 pg (ref 26.0–34.0)
MCHC: 35 g/dL (ref 30.0–36.0)
MCV: 87.4 fL (ref 80.0–100.0)
Platelets: 191 10*3/uL (ref 150–400)
RBC: 5.33 MIL/uL (ref 4.22–5.81)
RDW: 12.1 % (ref 11.5–15.5)
WBC: 6.1 10*3/uL (ref 4.0–10.5)
nRBC: 0 % (ref 0.0–0.2)

## 2020-06-27 LAB — TROPONIN I (HIGH SENSITIVITY): Troponin I (High Sensitivity): 7 ng/L (ref <18)

## 2020-06-27 LAB — FIBRIN DERIVATIVES D-DIMER (ARMC ONLY): Fibrin derivatives D-dimer (ARMC): 1230.1 ng/mL (FEU) — ABNORMAL HIGH (ref 0.00–499.00)

## 2020-06-27 MED ORDER — SODIUM CHLORIDE 0.9 % IV SOLN
100.0000 mg | Freq: Every day | INTRAVENOUS | Status: DC
  Administered 2020-06-29 – 2020-07-01 (×3): 100 mg via INTRAVENOUS
  Filled 2020-06-27 (×5): qty 20

## 2020-06-27 MED ORDER — LINAGLIPTIN 5 MG PO TABS
5.0000 mg | ORAL_TABLET | Freq: Every day | ORAL | Status: DC
  Administered 2020-06-28 – 2020-07-01 (×4): 5 mg via ORAL
  Filled 2020-06-27 (×4): qty 1

## 2020-06-27 MED ORDER — INSULIN ASPART 100 UNIT/ML ~~LOC~~ SOLN
0.0000 [IU] | Freq: Three times a day (TID) | SUBCUTANEOUS | Status: DC
  Administered 2020-06-28 – 2020-06-29 (×4): 8 [IU] via SUBCUTANEOUS
  Administered 2020-06-29: 5 [IU] via SUBCUTANEOUS
  Administered 2020-06-30: 12:00:00 11 [IU] via SUBCUTANEOUS
  Administered 2020-06-30: 08:00:00 8 [IU] via SUBCUTANEOUS
  Filled 2020-06-27 (×7): qty 1

## 2020-06-27 MED ORDER — ALBUTEROL SULFATE HFA 108 (90 BASE) MCG/ACT IN AERS
2.0000 | INHALATION_SPRAY | Freq: Four times a day (QID) | RESPIRATORY_TRACT | Status: DC
  Administered 2020-06-28 – 2020-07-01 (×13): 2 via RESPIRATORY_TRACT
  Filled 2020-06-27: qty 6.7

## 2020-06-27 MED ORDER — DEXAMETHASONE SODIUM PHOSPHATE 10 MG/ML IJ SOLN
6.0000 mg | INTRAMUSCULAR | Status: DC
  Administered 2020-06-28 – 2020-06-29 (×2): 6 mg via INTRAVENOUS
  Filled 2020-06-27 (×2): qty 1

## 2020-06-27 MED ORDER — ONDANSETRON HCL 4 MG/2ML IJ SOLN: 4.0000 mg | Freq: Four times a day (QID) | INTRAMUSCULAR | Status: DC | PRN

## 2020-06-27 MED ORDER — HYDROCOD POLST-CPM POLST ER 10-8 MG/5ML PO SUER
5.0000 mL | Freq: Two times a day (BID) | ORAL | Status: DC | PRN
  Administered 2020-06-28: 5 mL via ORAL
  Filled 2020-06-27: qty 5

## 2020-06-27 MED ORDER — ONDANSETRON HCL 4 MG PO TABS: 4.0000 mg | ORAL_TABLET | Freq: Four times a day (QID) | ORAL | Status: DC | PRN

## 2020-06-27 MED ORDER — SODIUM CHLORIDE 0.9 % IV SOLN
200.0000 mg | Freq: Once | INTRAVENOUS | Status: AC
  Administered 2020-06-27: 200 mg via INTRAVENOUS
  Filled 2020-06-27: qty 200

## 2020-06-27 MED ORDER — INSULIN DETEMIR 100 UNIT/ML ~~LOC~~ SOLN
0.1000 [IU]/kg | Freq: Two times a day (BID) | SUBCUTANEOUS | Status: DC
  Administered 2020-06-28 – 2020-06-30 (×5): 11 [IU] via SUBCUTANEOUS
  Filled 2020-06-27 (×8): qty 0.11

## 2020-06-27 MED ORDER — ASCORBIC ACID 500 MG PO TABS
500.0000 mg | ORAL_TABLET | Freq: Every day | ORAL | Status: DC
  Administered 2020-06-28 – 2020-07-01 (×4): 500 mg via ORAL
  Filled 2020-06-27 (×4): qty 1

## 2020-06-27 MED ORDER — INSULIN ASPART 100 UNIT/ML ~~LOC~~ SOLN
0.0000 [IU] | Freq: Every day | SUBCUTANEOUS | Status: DC
  Administered 2020-06-28: 3 [IU] via SUBCUTANEOUS
  Administered 2020-06-28: 2 [IU] via SUBCUTANEOUS
  Administered 2020-06-29: 3 [IU] via SUBCUTANEOUS
  Filled 2020-06-27 (×3): qty 1

## 2020-06-27 MED ORDER — DEXAMETHASONE SODIUM PHOSPHATE 10 MG/ML IJ SOLN
10.0000 mg | Freq: Once | INTRAMUSCULAR | Status: AC
  Administered 2020-06-27: 10 mg via INTRAVENOUS
  Filled 2020-06-27: qty 1

## 2020-06-27 MED ORDER — ZINC SULFATE 220 (50 ZN) MG PO CAPS
220.0000 mg | ORAL_CAPSULE | Freq: Every day | ORAL | Status: DC
  Administered 2020-06-28 – 2020-07-01 (×4): 220 mg via ORAL
  Filled 2020-06-27 (×4): qty 1

## 2020-06-27 MED ORDER — GUAIFENESIN-DM 100-10 MG/5ML PO SYRP: 10.0000 mL | ORAL_SOLUTION | ORAL | Status: DC | PRN

## 2020-06-27 MED ORDER — SODIUM CHLORIDE 0.9 % IV SOLN: 100.0000 mg | Freq: Every day | INTRAVENOUS | Status: DC

## 2020-06-27 MED ORDER — SODIUM CHLORIDE 0.9 % IV SOLN: Freq: Once | INTRAVENOUS | Status: AC

## 2020-06-27 MED ORDER — SODIUM CHLORIDE 0.9 % IV SOLN: 200.0000 mg | Freq: Once | INTRAVENOUS | Status: DC

## 2020-06-27 MED ORDER — ENOXAPARIN SODIUM 40 MG/0.4ML ~~LOC~~ SOLN
40.0000 mg | SUBCUTANEOUS | Status: DC
  Administered 2020-06-28: 40 mg via SUBCUTANEOUS
  Filled 2020-06-27: qty 0.4

## 2020-06-27 MED ORDER — ACETAMINOPHEN 325 MG PO TABS
650.0000 mg | ORAL_TABLET | Freq: Four times a day (QID) | ORAL | Status: DC | PRN
  Administered 2020-06-29: 22:00:00 650 mg via ORAL
  Filled 2020-06-27: qty 2

## 2020-06-27 MED ORDER — ADULT MULTIVITAMIN W/MINERALS CH
1.0000 | ORAL_TABLET | Freq: Every day | ORAL | Status: DC
  Administered 2020-06-28 – 2020-07-01 (×4): 1 via ORAL
  Filled 2020-06-27 (×4): qty 1

## 2020-06-27 NOTE — H&P (Signed)
History and Physical    Miguel Costa IFO:[id number] DOB: 03-08-1971 DOA: 06/27/2020  PCP: Patient, No Pcp Per   Patient coming from: Home  I have personally briefly reviewed patient's old medical records in [facility]  Chief Complaint: Shortness of breath, fever, diarrhea, vomiting, Covid positive  HPI: Miguel Costa is a 49 y.o. male with medical history significant for diabetes and hypertension, CKD 3a, Covid positive since 9/30 but with symptom onset since 9/25, who did not get monoclonal antibodies as he was outside of the 10-day window who presents to the emergency room with worsening shortness of breath and cough, fever and chills, diarrhea with intractable nausea and vomiting starting today, with poor oral intake for the past 2 days.  He complains of substernal chest pain of moderate intensity nonradiating with no aggravating or alleviating factors.  Has been using acetaminophen and ibuprofen without help. ED Course: On arrival, O2 sat reportedly 91% at rest and 87% with movement and he appeared unwell and had increased work of breathing.  Temperature 100.2, BP 103/78, heart rate 107, respirations 20.  Placed on O2 at 2 L with improvement in sats to the low 90s.  Blood work notable for creatinine of 1.44 which is about his baseline of 1.3.  D-dimer 1200. EKG as reviewed by me : Sinus tachycardia with rate of 106 with no acute ST-T wave changes. Chest x-ray showed Superimposed patchy pulmonary infiltrate within the left mid lung zone, possibly infectious or inflammatory in nature. Patient started on remdesivir and Decadron.  Hospitalist consulted for admission.  Review of Systems: As per HPI otherwise all other systems on review of systems negative.    Past Medical History:  Diagnosis Date  . Hypertension     Past Surgical History:  Procedure Laterality Date  . BACK SURGERY    . LUMBAR LAMINECTOMY     multiple  . NASAL SEPTOPLASTY W/ TURBINOPLASTY    . NOSE SURGERY        reports that he has never smoked. He has never used smokeless tobacco. He reports current alcohol use. He reports that he does not use drugs.  No Known Allergies  History reviewed. No pertinent family history.    Prior to Admission medications   Medication Sig Start Date End Date Taking? Authorizing Provider  ibuprofen (ADVIL) 200 MG tablet Take 200-400 mg by mouth every 6 (six) hours as needed for fever or mild pain.     [provider]  metFORMIN (GLUCOPHAGE) 500 MG tablet Take 1 tablet (500 mg total) by mouth 2 (two) times daily with a meal. 01/07/20 01/06/21  Phineas Semen, MD  valsartan (DIOVAN) 160 MG tablet Take 160 mg by mouth daily.  03/14/18 04/10/19  [provider]    Physical Exam: Vitals:   06/27/20 2009 06/27/20 2135 06/27/20 2145 06/27/20 2215  BP:  117/76    Pulse:  86  92  Resp:  18    Temp:      TempSrc:      SpO2:  90% (!) 89% 93%  Weight: 102.5 kg     Height: 5\' 11"  (1.803 m)        Vitals:   06/27/20 2009 06/27/20 2135 06/27/20 2145 06/27/20 2215  BP:  117/76    Pulse:  86  92  Resp:  18    Temp:      TempSrc:      SpO2:  90% (!) 89% 93%  Weight: 102.5 kg     Height: 5'  11" (1.803 m)         Constitutional:  Ill-appearing, tachypneic speaking in short sentences HEENT:      Head: Normocephalic and atraumatic.         Eyes: PERLA, EOMI, Conjunctivae are normal. Sclera is non-icteric.       Mouth/Throat: Mucous membranes are moist.       Neck: Supple with no signs of meningismus. Cardiovascular:  Tachycardic. No murmurs, gallops, or rubs. 2+ symmetrical distal pulses are present . No JVD. No LE edema Respiratory: Respiratory effort increased with coarse breath sounds bilaterally.  Tachypneic with increased work of breathing. Gastrointestinal: Soft, non tender, and non distended with positive bowel sounds. No rebound or guarding. Genitourinary: No CVA tenderness. Musculoskeletal: Nontender with normal range of motion in all  extremities. No cyanosis, or erythema of extremities. Neurologic:  Face is symmetric. Moving all extremities. No gross focal neurologic deficits . Skin: Skin is warm, dry.  No rash or ulcers Psychiatric: Mood and affect are normal    Labs on Admission: I have personally reviewed following labs and imaging studies  CBC: Recent Labs  Lab 06/27/20 2023  WBC 6.1  HGB 16.3  HCT 46.6  MCV 87.4  PLT 191   Basic Metabolic Panel: Recent Labs  Lab 06/27/20 2023  NA 134*  K 4.2  CL 102  CO2 22  GLUCOSE 201*  BUN 20  CREATININE 1.44*  CALCIUM 8.8*   GFR: Estimated Creatinine Clearance: 75.7 mL/min (A) (by C-G formula based on SCr of 1.44 mg/dL (H)). Liver Function Tests: No results for input(s): AST, ALT, ALKPHOS, BILITOT, PROT, ALBUMIN in the last 168 hours. No results for input(s): LIPASE, AMYLASE in the last 168 hours. No results for input(s): AMMONIA in the last 168 hours. Coagulation Profile: No results for input(s): INR, PROTIME in the last 168 hours. Cardiac Enzymes: No results for input(s): CKTOTAL, CKMB, CKMBINDEX, TROPONINI in the last 168 hours. BNP (last 3 results) No results for input(s): PROBNP in the last 8760 hours. HbA1C: No results for input(s): HGBA1C in the last 72 hours. CBG: No results for input(s): GLUCAP in the last 168 hours. Lipid Profile: No results for input(s): CHOL, HDL, LDLCALC, TRIG, CHOLHDL, LDLDIRECT in the last 72 hours. Thyroid Function Tests: No results for input(s): TSH, T4TOTAL, FREET4, T3FREE, THYROIDAB in the last 72 hours. Anemia Panel: No results for input(s): VITAMINB12, FOLATE, FERRITIN, TIBC, IRON, RETICCTPCT in the last 72 hours. Urine analysis:    Component Value Date/Time   COLORURINE COLORLESS (A) 01/07/2020 1915   APPEARANCEUR CLEAR (A) 01/07/2020 1915   APPEARANCEUR Clear 01/30/2014 0828   LABSPEC 1.024 01/07/2020 1915   LABSPEC 1.027 01/30/2014 0828   PHURINE 6.0 01/07/2020 1915   GLUCOSEU >=500 (A) 01/07/2020 1915    GLUCOSEU Negative 01/30/2014 0828   HGBUR MODERATE (A) 01/07/2020 1915   BILIRUBINUR NEGATIVE 01/07/2020 1915   BILIRUBINUR Negative 01/30/2014 0828   KETONESUR NEGATIVE 01/07/2020 1915   PROTEINUR NEGATIVE 01/07/2020 1915   NITRITE NEGATIVE 01/07/2020 1915   LEUKOCYTESUR NEGATIVE 01/07/2020 1915   LEUKOCYTESUR Negative 01/30/2014 0828    Radiological Exams on Admission: DG Chest Portable 1 View  Result Date: 06/27/2020 CLINICAL DATA:  Chest pain EXAM: PORTABLE CHEST 1 VIEW COMPARISON:  04/10/2019 FINDINGS: Lung volumes are small. Mild left-sided relative volume loss likely related to chest wall deformity. Patchy left perihilar pulmonary infiltrate is present, possibly infectious or inflammatory in the acute setting. No pneumothorax or pleural effusion. Cardiac size is within normal limits. No acute  bone abnormality. IMPRESSION: Pulmonary hypoinflation. Superimposed patchy pulmonary infiltrate within the left mid lung zone, possibly infectious or inflammatory in nature. Electronically Signed   By: Helyn Numbers MD   On: 06/27/2020 22:16     Assessment/Plan 49 year old male with history of diabetes,hypertension, CKD 3a, Covid positive since 9/30 but with symptom onset since 9/25, presenting with worsening shortness of breath and cough, fever and chills, diarrhea with intractable nausea and vomiting starting today, with poor oral intake for the past 2 days.    Acute respiratory failure due to COVID-19 [facility]):   Gastroenteritis due to COVID-19 virus   Pneumonia due to COVID-19 virus -Remdesivir, Decadron, albuterol, antitussives and vitamins -Supplemental oxygen to keep sats over 92% with proning as tolerated -Follow inflammatory biomarkers -Given chest pain as well as elevated D-dimer will get CTA to evaluate for PE    HTN (hypertension) -Continue lisinopril    Type 2 diabetes mellitus without complication (HCC) -Sliding scale insulin.  Follow A1c    Chronic kidney disease, stage  3a (HCC) -Renal function at baseline    DVT prophylaxis: Lovenox  Code Status: full code  Family Communication:  none  Disposition Plan: Back to previous home environment Consults called: none  Status:At the time of admission, it appears that the appropriate admission status for this patient is INPATIENT. This is judged to be reasonable and necessary in order to provide the required intensity of service to ensure the patient's safety given the presenting symptoms, physical exam findings, and initial radiographic and laboratory data in the context of their  Comorbid conditions.   Patient requires inpatient status due to high intensity of service, high risk for further deterioration and high frequency of surveillance required.   I certify that at the point of admission it is my clinical judgment that the patient will require inpatient hospital care spanning beyond 2 midnights     Andris Baumann MD [facility]     06/27/2020, 11:54 PM

## 2020-06-27 NOTE — ED Triage Notes (Addendum)
Pt reports COVID positive test approx 14 days ago  Patient wheelchair to triage with complaints of CP, neck pain, and diarrhea for 3 days.  Connstant pain in substernal chest and lower anterior neck area.  Pt reports Diarrhea that has increased in frequency in the last 24 hours, pt denies radiation of CP, no vomiting/nausea or dizziness. Pt reports acetaminophen and IBU over the last 24 hours for fever and pain.

## 2020-06-27 NOTE — ED Notes (Signed)
Pt placed on 2LPM of oxygen via [place]. Pt placed on pulse ox, bp monitor and cardiac monitor.

## 2020-06-27 NOTE — Progress Notes (Signed)
Remdesivir - Pharmacy Brief Note   O:  ALT:  CXR:  SpO2: 89 % on RA    A/P:  Remdesivir 200 mg IVPB once followed by 100 mg IVPB daily x 4 days.   Miguel Costa D 06/27/2020 10:08 PM

## 2020-06-27 NOTE — ED Provider Notes (Signed)
[facility] Emergency Department Provider Note   ____________________________________________   I have reviewed the triage vital signs and the nursing notes.   HISTORY  Chief Complaint Diarrhea, Chest Pain, and Shortness of Breath   History limited by: Not Limited   HPI Miguel Costa is a 49 y.o. male who presents to the emergency department today because of concerns for depth related to recent Covid diagnosis.  Patient was diagnosed with Covid late last month.  He states he is continued to have fevers.  He states that the are somewhat cyclical will have fevers and then chills.  He has been trying some over-the-counter medication without any great relief.  The patient also complains of some shortness of breath, nausea and diarrhea.  The patient states he has not eaten anything for the past 3 days.  Records reviewed. Per medical record review patient has a history of HTN. Recent diagnosis of COVID.  Past Medical History:  Diagnosis Date  . Hypertension     Patient Active Problem List   Diagnosis Date Noted  . Allergic rhinitis 12/23/2015  . BP (high blood pressure) 12/23/2015    Past Surgical History:  Procedure Laterality Date  . BACK SURGERY    . LUMBAR LAMINECTOMY     multiple  . NASAL SEPTOPLASTY W/ TURBINOPLASTY    . NOSE SURGERY      Prior to Admission medications   Medication Sig Start Date End Date Taking? Authorizing Provider  ibuprofen (ADVIL) 200 MG tablet Take 200-400 mg by mouth every 6 (six) hours as needed for fever or mild pain.     [provider]  metFORMIN (GLUCOPHAGE) 500 MG tablet Take 1 tablet (500 mg total) by mouth 2 (two) times daily with a meal. 01/07/20 01/06/21  Phineas Semen, MD  valsartan (DIOVAN) 160 MG tablet Take 160 mg by mouth daily.  03/14/18 04/10/19  [provider]    Allergies Patient has no known allergies.  History reviewed. No pertinent family history.  Social History Social  History   Tobacco Use  . Smoking status: Never Smoker  . Smokeless tobacco: Never Used  Vaping Use  . Vaping Use: Never used  Substance Use Topics  . Alcohol use: Yes    Alcohol/week: 0.0 standard drinks    Comment: rarely  . Drug use: Never    Review of Systems Constitutional: Positive for fever/chills.  Eyes: No visual changes. ENT: Positive for sore throat. Cardiovascular: Positive for chest pain. Respiratory: Positive for shortness of breath. Gastrointestinal: Positive for abdominal pain, nausea, vomiting and diarrhea.  Genitourinary: Negative for dysuria. Musculoskeletal: Negative for back pain. Skin: Negative for rash. Neurological: Negative for headaches, focal weakness or numbness.  ____________________________________________   PHYSICAL EXAM:  VITAL SIGNS: ED Triage Vitals  Enc Vitals Group     BP 06/27/20 2008 103/78     Pulse Rate 06/27/20 2008 (!) 107     Resp 06/27/20 2008 20     Temp 06/27/20 2008 100.2 F (37.9 C)     Temp Source 06/27/20 2008 Oral     SpO2 06/27/20 2008 91 %     Weight 06/27/20 2009 226 lb (102.5 kg)     Height 06/27/20 2009 5\' 11"  (1.803 m)     Head Circumference --      Peak Flow --      Pain Score 06/27/20 2009 5   Constitutional: Alert and oriented.  Eyes: Conjunctivae are normal.  ENT      Head: Normocephalic and  atraumatic.      Nose: No congestion/rhinnorhea.      Mouth/Throat: Mucous membranes are moist.      Neck: No stridor. Hematological/Lymphatic/Immunilogical: No cervical lymphadenopathy. Cardiovascular: Normal rate, regular rhythm.  No murmurs, rubs, or gallops.  Respiratory: Normal respiratory effort without tachypnea nor retractions. Breath sounds are clear and equal bilaterally. No wheezes/rales/rhonchi. Gastrointestinal: Soft and non tender. No rebound. No guarding.  Genitourinary: Deferred Musculoskeletal: Normal range of motion in all extremities. No lower extremity edema. Neurologic:  Normal speech and  language. No gross focal neurologic deficits are appreciated.  Skin:  Skin is warm, dry and intact. No rash noted. Psychiatric: Mood and affect are normal. Speech and behavior are normal. Patient exhibits appropriate insight and judgment.  ____________________________________________    LABS (pertinent positives/negatives)  CBC wbc 6.1, hgb 16.3, plt 191 Trop hs 7 BMP na 134, k 4.2, glu 201, cr 1.44 ____________________________________________   EKG  I, Phineas Semen, attending physician, personally viewed and interpreted this EKG  EKG Time: 2015 Rate: 106 Rhythm: sinus tachycardia Axis: left axis deviation Intervals: qtc 406 QRS: LAFB ST changes: no st elevation Impression: abnormal ekg ____________________________________________    RADIOLOGY  CXR Patchy lung infiltrate to left lung.   ____________________________________________   PROCEDURES  Procedures  ____________________________________________   INITIAL IMPRESSION / ASSESSMENT AND PLAN / ED COURSE  Pertinent labs & imaging results that were available during my care of the patient were reviewed by me and considered in my medical decision making (see chart for details).   Patient presented to the emergency department today with symptoms consistent with known diagnosis of covid. Patient was hypoxic on room air while laying in the stretcher. Will plan on admission. Discussed plan of care with patient.    ____________________________________________   FINAL CLINICAL IMPRESSION(S) / ED DIAGNOSES  Final diagnoses:  COVID-19  Hypoxemia     Note: This dictation was prepared with Dragon dictation. Any transcriptional errors that result from this process are unintentional     Phineas Semen, MD 06/28/20 1553

## 2020-06-28 ENCOUNTER — Inpatient Hospital Stay: Payer: 59

## 2020-06-28 DIAGNOSIS — N1831 Chronic kidney disease, stage 3a: Secondary | ICD-10-CM

## 2020-06-28 LAB — COMPREHENSIVE METABOLIC PANEL
ALT: 37 U/L (ref 0–44)
AST: 33 U/L (ref 15–41)
Albumin: 3.2 g/dL — ABNORMAL LOW (ref 3.5–5.0)
Alkaline Phosphatase: 52 U/L (ref 38–126)
Anion gap: 11 (ref 5–15)
BUN: 21 mg/dL — ABNORMAL HIGH (ref 6–20)
CO2: 22 mmol/L (ref 22–32)
Calcium: 8.2 mg/dL — ABNORMAL LOW (ref 8.9–10.3)
Chloride: 101 mmol/L (ref 98–111)
Creatinine, Ser: 1.35 mg/dL — ABNORMAL HIGH (ref 0.61–1.24)
GFR, Estimated: >60 mL/min (ref >60)
Glucose, Bld: 290 mg/dL — ABNORMAL HIGH (ref 70–99)
Potassium: 4.1 mmol/L (ref 3.5–5.1)
Sodium: 134 mmol/L — ABNORMAL LOW (ref 135–145)
Total Bilirubin: 1 mg/dL (ref 0.3–1.2)
Total Protein: 6.7 g/dL (ref 6.5–8.1)

## 2020-06-28 LAB — CBC WITH DIFFERENTIAL/PLATELET
Abs Immature Granulocytes: 0.02 10*3/uL (ref 0.00–0.07)
Basophils Absolute: 0 10*3/uL (ref 0.0–0.1)
Basophils Relative: 0 %
Eosinophils Absolute: 0 10*3/uL (ref 0.0–0.5)
Eosinophils Relative: 0 %
HCT: 42 % (ref 39.0–52.0)
Hemoglobin: 14.6 g/dL (ref 13.0–17.0)
Immature Granulocytes: 1 %
Lymphocytes Relative: 13 %
Lymphs Abs: 0.4 10*3/uL — ABNORMAL LOW (ref 0.7–4.0)
MCH: 30.8 pg (ref 26.0–34.0)
MCHC: 34.8 g/dL (ref 30.0–36.0)
MCV: 88.6 fL (ref 80.0–100.0)
Monocytes Absolute: 0.1 10*3/uL (ref 0.1–1.0)
Monocytes Relative: 2 %
Neutro Abs: 2.7 10*3/uL (ref 1.7–7.7)
Neutrophils Relative %: 84 %
Platelets: 174 10*3/uL (ref 150–400)
RBC: 4.74 MIL/uL (ref 4.22–5.81)
RDW: 12.3 % (ref 11.5–15.5)
WBC: 3.2 10*3/uL — ABNORMAL LOW (ref 4.0–10.5)
nRBC: 0 % (ref 0.0–0.2)

## 2020-06-28 LAB — C-REACTIVE PROTEIN: CRP: 6.1 mg/dL — ABNORMAL HIGH (ref <1.0)

## 2020-06-28 LAB — GLUCOSE, CAPILLARY
Glucose-Capillary: 272 mg/dL — ABNORMAL HIGH (ref 70–99)
Glucose-Capillary: 271 mg/dL — ABNORMAL HIGH (ref 70–99)
Glucose-Capillary: 265 mg/dL — ABNORMAL HIGH (ref 70–99)
Glucose-Capillary: 221 mg/dL — ABNORMAL HIGH (ref 70–99)
Glucose-Capillary: 220 mg/dL — ABNORMAL HIGH (ref 70–99)

## 2020-06-28 LAB — HIV ANTIBODY (ROUTINE TESTING W REFLEX): HIV Screen 4th Generation wRfx: NONREACTIVE

## 2020-06-28 LAB — HEMOGLOBIN A1C
Hgb A1c MFr Bld: 6.8 % — ABNORMAL HIGH (ref 4.8–5.6)
Mean Plasma Glucose: 148.46 mg/dL

## 2020-06-28 LAB — FERRITIN: Ferritin: 823 ng/mL — ABNORMAL HIGH (ref 24–336)

## 2020-06-28 LAB — FIBRIN DERIVATIVES D-DIMER (ARMC ONLY): Fibrin derivatives D-dimer (ARMC): 1095.99 ng/mL (FEU) — ABNORMAL HIGH (ref 0.00–499.00)

## 2020-06-28 MED ORDER — PNEUMOCOCCAL VAC POLYVALENT 25 MCG/0.5ML IJ INJ
0.5000 mL | INJECTION | INTRAMUSCULAR | Status: AC
  Administered 2020-06-30: 0.5 mL via INTRAMUSCULAR
  Filled 2020-06-28: qty 0.5

## 2020-06-28 MED ORDER — IOHEXOL 350 MG/ML SOLN
100.0000 mL | Freq: Once | INTRAVENOUS | Status: AC | PRN
  Administered 2020-06-28: 100 mL via INTRAVENOUS

## 2020-06-28 NOTE — Progress Notes (Signed)
PROGRESS NOTE    Miguel Costa  OMV:[id number] DOB: August 16, 1971 DOA: 06/27/2020 PCP: Patient, No Pcp Per   Chief Complain: Shortness of breath, fever, diarrhea, vomiting  Brief Narrative: Patient is a 49 year old male with history of diabetes type 2, hypertension, CKD stage IIIa, diagnosed with Covid on 9/30 who presents to the emergency room with complaints of shortness of breath, cough, fever, chills, diarrhea, nausea/vomiting, poor oral intake. His symptoms started on 9/25. On arrival, he was hypoxic on even mild exertion and had increased work of breathing. He was febrile with temperature 100.2, tachycardic. Saturation improved when he was placed on oxygen at 2 to 3 L/min. Chest x-ray showed superimposed patchy pulmonary infiltrate within the left lung. Patient was started for management of Covid pneumonia and is on remdesivir and Decadron.  Assessment & Plan:   Principal Problem:   Acute respiratory failure due to COVID-19 [facility]) Active Problems:   HTN (hypertension)   Gastroenteritis due to COVID-19 virus   Pneumonia due to COVID-19 virus   Type 2 diabetes mellitus without complication (HCC)   Chronic kidney disease, stage 3a (HCC)   Acute hypoxic respiratory failure from Covid pneumonia: Presented with poor appetite, nausea, vomiting, shortness of breath, fever, chills, diarrhea. Chest x-ray as above. Started on remdesivir and Decadron. Continue bronchodilators, supplemental oxygen, anticough medications. Try to wean the oxygen. Will follow inflammatory markers. CT of the chest did not show any PE but confirmed multifocal pulmonary infiltrates. Patient is not vaccinated against Covid and he has no plans for vaccination in the future.Marland Kitchen His abdominal symptoms have resolved and he is tolerating diet. Currently afebrile.  Hypertension: Continue monitor blood pressure. Continue lisinopril.  Diabetes type 2 with hyperglycemia: Monitor blood sugars. Continue current regimen of insulin.  Hemoglobin A1c pending  CKD stage IIIa: Currently kidney function at baseline.         DVT prophylaxis: Lovenox Code Status: Full Family Communication: None  Status is: Inpatient  Remains inpatient appropriate because:Inpatient level of care appropriate due to severity of illness   Dispo: The patient is from: Home              Anticipated d/c is to: Home              Anticipated d/c date is:1-2 days              Patient currently is not medically stable to d/c.  We will complete the course of remdesivir and continue to try to wean the oxygen before discharge planning.   Consultants: None  Procedures:None  Antimicrobials:  Anti-infectives (From admission, onward)   Start     Dose/Rate Route Frequency Ordered Stop   06/28/20 1000  remdesivir 100 mg in sodium chloride 0.9 % 100 mL IVPB       "Followed by" Linked Group Details   100 mg 200 mL/hr over 30 Minutes Intravenous Daily 06/27/20 2208 07/02/20 0959   06/28/20 1000  remdesivir 100 mg in sodium chloride 0.9 % 100 mL IVPB  Status:  Discontinued       "Followed by" Linked Group Details   100 mg 200 mL/hr over 30 Minutes Intravenous Daily 06/27/20 2354 06/28/20 0332   06/28/20 0000  remdesivir 200 mg in sodium chloride 0.9% 250 mL IVPB  Status:  Discontinued       "Followed by" Linked Group Details   200 mg 580 mL/hr over 30 Minutes Intravenous Once 06/27/20 2354 06/28/20 0332   06/27/20 2215  remdesivir 200 mg in sodium chloride  0.9% 250 mL IVPB       "Followed by" Linked Group Details   200 mg 580 mL/hr over 30 Minutes Intravenous Once 06/27/20 2208 06/28/20 0111      Subjective:  Patient seen and examined the bedside this morning. Hemodynamically stable. Comfortable. On 2 L of oxygen during my evaluation. Feels much better today. Denies any shortness of breath or cough or nausea or vomiting. Diarrhea has resolved.  Objective: Vitals:   06/28/20 0245 06/28/20 0248 06/28/20 0313 06/28/20 0722  BP: 111/90   120/84 (!) 127/94  Pulse: 77  78 70  Resp: (!) 30  20 18   Temp:  98.6 F (37 C) 98.7 F (37.1 C) 98.2 F (36.8 C)  TempSrc:  Oral Oral Oral  SpO2: 92%  94% 95%  Weight:   105 kg   Height:   5\' 11"  (1.803 m)     Intake/Output Summary (Last 24 hours) at 06/28/2020 0731 Last data filed at 06/28/2020 0330 Gross per 24 hour  Intake 120 ml  Output --  Net 120 ml   Filed Weights   06/27/20 2009 06/28/20 0313  Weight: 102.5 kg 105 kg    Examination:  General exam: Appears calm and comfortable ,Not in distress,average built HEENT:PERRL,Oral mucosa moist, Ear/Nose normal on gross exam Respiratory system: Bilateral diminished air sounds, no wheezes or crackles  cardiovascular system: S1 & S2 heard, RRR. No JVD, murmurs, rubs, gallops or clicks. No pedal edema. Gastrointestinal system: Abdomen is nondistended, soft and nontender. No organomegaly or masses felt. Normal bowel sounds heard. Central nervous system: Alert and oriented. No focal neurological deficits. Extremities: No edema, no clubbing ,no cyanosis, distal peripheral pulses palpable. Skin: No rashes, lesions or ulcers,no icterus ,no pallor  Data Reviewed: I have personally reviewed following labs and imaging studies  CBC: Recent Labs  Lab 06/27/20 2023 06/28/20 0330  WBC 6.1 3.2*  NEUTROABS  --  2.7  HGB 16.3 14.6  HCT 46.6 42.0  MCV 87.4 88.6  PLT 191 174   Basic Metabolic Panel: Recent Labs  Lab 06/27/20 2023 06/28/20 0330  NA 134* 134*  K 4.2 4.1  CL 102 101  CO2 22 22  GLUCOSE 201* 290*  BUN 20 21*  CREATININE 1.44* 1.35*  CALCIUM 8.8* 8.2*   GFR: Estimated Creatinine Clearance: 81.6 mL/min (A) (by C-G formula based on SCr of 1.35 mg/dL (H)). Liver Function Tests: Recent Labs  Lab 06/28/20 0330  AST 33  ALT 37  ALKPHOS 52  BILITOT 1.0  PROT 6.7  ALBUMIN 3.2*   No results for input(s): LIPASE, AMYLASE in the last 168 hours. No results for input(s): AMMONIA in the last 168  hours. Coagulation Profile: No results for input(s): INR, PROTIME in the last 168 hours. Cardiac Enzymes: No results for input(s): CKTOTAL, CKMB, CKMBINDEX, TROPONINI in the last 168 hours. BNP (last 3 results) No results for input(s): PROBNP in the last 8760 hours. HbA1C: No results for input(s): HGBA1C in the last 72 hours. CBG: Recent Labs  Lab 06/28/20 0340 06/28/20 0721  GLUCAP 272* 271*   Lipid Profile: No results for input(s): CHOL, HDL, LDLCALC, TRIG, CHOLHDL, LDLDIRECT in the last 72 hours. Thyroid Function Tests: No results for input(s): TSH, T4TOTAL, FREET4, T3FREE, THYROIDAB in the last 72 hours. Anemia Panel: Recent Labs    06/28/20 0330  FERRITIN 823*   Sepsis Labs: No results for input(s): PROCALCITON, LATICACIDVEN in the last 168 hours.  No results found for this or any previous visit (from  the past 240 hour(s)).       Radiology Studies: CT ANGIO CHEST PE W OR WO CONTRAST  Result Date: 06/28/2020 CLINICAL DATA:  Chest pain positive COVID test EXAM: CT ANGIOGRAPHY CHEST WITH CONTRAST TECHNIQUE: Multidetector CT imaging of the chest was performed using the standard protocol during bolus administration of intravenous contrast. Multiplanar CT image reconstructions and MIPs were obtained to evaluate the vascular anatomy. CONTRAST:  OMNIPAQUE IOHEXOL 350 MG/ML SOLN COMPARISON:  None. FINDINGS: Cardiovascular: There is relatively poor opacification of the pulmonary arterial tree and the examination is capable of excluding only large central pulmonary emboli through the level of the main right and left pulmonary arteries. There is no intraluminal filling defect within the structures. The lobar, segmental, and subsegmental pulmonary arterial tree is not adequately opacified to definitively exclude the presence of pulmonary embolism. The central pulmonary arteries are of normal caliber. Mild coronary artery calcification. Global cardiac size within normal limits. No  pericardial effusion. The thoracic aorta is unremarkable. Mediastinum/Nodes: Thyroid unremarkable. No pathologic thoracic adenopathy. Esophagus unremarkable. Lungs/Pleura: There are moderate bilateral scattered airspace infiltrates, slightly more prevalent within the posterior basal lower lobes bilaterally. The findings are not prototypical but can be seen in acute to subacute COVID-19 pneumonia, though alternative viral pneumonias could result in a similar appearance. No pneumothorax or pleural effusion. No central obstructing mass. Upper Abdomen: No acute abnormality. Musculoskeletal: Multiple healed left rib fractures are noted. No acute bone abnormality. Review of the MIP images confirms the above findings. IMPRESSION: Multifocal pulmonary infiltrates, likely infectious. These can be seen in COVID-19 pneumonia or alternative viral pneumonias. Moderate parenchymal involvement. Technically limited examination for evaluation of pulmonary embolism. No large central pulmonary embolus identified. Electronically Signed   By: Helyn Numbers MD   On: 06/28/2020 03:24   DG Chest Portable 1 View  Result Date: 06/27/2020 CLINICAL DATA:  Chest pain EXAM: PORTABLE CHEST 1 VIEW COMPARISON:  04/10/2019 FINDINGS: Lung volumes are small. Mild left-sided relative volume loss likely related to chest wall deformity. Patchy left perihilar pulmonary infiltrate is present, possibly infectious or inflammatory in the acute setting. No pneumothorax or pleural effusion. Cardiac size is within normal limits. No acute bone abnormality. IMPRESSION: Pulmonary hypoinflation. Superimposed patchy pulmonary infiltrate within the left mid lung zone, possibly infectious or inflammatory in nature. Electronically Signed   By: Helyn Numbers MD   On: 06/27/2020 22:16        Scheduled Meds: . albuterol  2 puff Inhalation Q6H  . vitamin C  500 mg Oral Daily  . dexamethasone (DECADRON) injection  6 mg Intravenous Q24H  . enoxaparin  (LOVENOX) injection  40 mg Subcutaneous Q24H  . insulin aspart  0-15 Units Subcutaneous TID WC  . insulin aspart  0-5 Units Subcutaneous QHS  . insulin detemir  0.1 Units/kg Subcutaneous BID  . linagliptin  5 mg Oral Daily  . multivitamin with minerals  1 tablet Oral Daily  . [START ON 06/29/2020] pneumococcal 23 valent vaccine  0.5 mL Intramuscular Tomorrow-1000  . zinc sulfate  220 mg Oral Daily   Continuous Infusions: . remdesivir 100 mg in NS 100 mL       LOS: 1 day    Time spent: More than 50% of that time was spent in counseling and/or coordination of care.      Burnadette Pop, MD [facility] P10/06/2020, 7:31 AM

## 2020-06-28 NOTE — ED Notes (Signed)
RN attempted to call report [phone] 

## 2020-06-28 NOTE — ED Notes (Signed)
Pt O2 sat at 88% when in room, pt placed on 3 L from 2 L. Pt now satting at 94%.

## 2020-06-29 LAB — COMPREHENSIVE METABOLIC PANEL
ALT: 36 U/L (ref 0–44)
AST: 29 U/L (ref 15–41)
Albumin: 3.1 g/dL — ABNORMAL LOW (ref 3.5–5.0)
Alkaline Phosphatase: 49 U/L (ref 38–126)
Anion gap: 8 (ref 5–15)
BUN: 30 mg/dL — ABNORMAL HIGH (ref 6–20)
CO2: 26 mmol/L (ref 22–32)
Calcium: 8.8 mg/dL — ABNORMAL LOW (ref 8.9–10.3)
Chloride: 103 mmol/L (ref 98–111)
Creatinine, Ser: 1.27 mg/dL — ABNORMAL HIGH (ref 0.61–1.24)
GFR, Estimated: >60 mL/min (ref >60)
Glucose, Bld: 266 mg/dL — ABNORMAL HIGH (ref 70–99)
Potassium: 4.6 mmol/L (ref 3.5–5.1)
Sodium: 137 mmol/L (ref 135–145)
Total Bilirubin: 0.7 mg/dL (ref 0.3–1.2)
Total Protein: 6.5 g/dL (ref 6.5–8.1)

## 2020-06-29 LAB — CBC WITH DIFFERENTIAL/PLATELET
Abs Immature Granulocytes: 0.04 10*3/uL (ref 0.00–0.07)
Basophils Absolute: 0 10*3/uL (ref 0.0–0.1)
Basophils Relative: 0 %
Eosinophils Absolute: 0 10*3/uL (ref 0.0–0.5)
Eosinophils Relative: 0 %
HCT: 44.3 % (ref 39.0–52.0)
Hemoglobin: 15.2 g/dL (ref 13.0–17.0)
Immature Granulocytes: 1 %
Lymphocytes Relative: 9 %
Lymphs Abs: 0.6 10*3/uL — ABNORMAL LOW (ref 0.7–4.0)
MCH: 30.8 pg (ref 26.0–34.0)
MCHC: 34.3 g/dL (ref 30.0–36.0)
MCV: 89.7 fL (ref 80.0–100.0)
Monocytes Absolute: 0.2 10*3/uL (ref 0.1–1.0)
Monocytes Relative: 3 %
Neutro Abs: 6.4 10*3/uL (ref 1.7–7.7)
Neutrophils Relative %: 87 %
Platelets: 208 10*3/uL (ref 150–400)
RBC: 4.94 MIL/uL (ref 4.22–5.81)
RDW: 12.3 % (ref 11.5–15.5)
WBC: 7.3 10*3/uL (ref 4.0–10.5)
nRBC: 0 % (ref 0.0–0.2)

## 2020-06-29 LAB — GLUCOSE, CAPILLARY
Glucose-Capillary: 238 mg/dL — ABNORMAL HIGH (ref 70–99)
Glucose-Capillary: 291 mg/dL — ABNORMAL HIGH (ref 70–99)
Glucose-Capillary: 277 mg/dL — ABNORMAL HIGH (ref 70–99)
Glucose-Capillary: 298 mg/dL — ABNORMAL HIGH (ref 70–99)

## 2020-06-29 LAB — FIBRIN DERIVATIVES D-DIMER (ARMC ONLY): Fibrin derivatives D-dimer (ARMC): 1014.28 ng/mL (FEU) — ABNORMAL HIGH (ref 0.00–499.00)

## 2020-06-29 LAB — C-REACTIVE PROTEIN: CRP: 4.1 mg/dL — ABNORMAL HIGH (ref <1.0)

## 2020-06-29 LAB — FERRITIN: Ferritin: 871 ng/mL — ABNORMAL HIGH (ref 24–336)

## 2020-06-29 MED ORDER — LOPERAMIDE HCL 2 MG PO CAPS
2.0000 mg | ORAL_CAPSULE | ORAL | Status: DC | PRN
  Filled 2020-06-29: qty 1

## 2020-06-29 MED ORDER — LIVING WELL WITH DIABETES BOOK
Freq: Once | Status: AC
  Filled 2020-06-29: qty 1

## 2020-06-29 MED ORDER — INSULIN ASPART 100 UNIT/ML ~~LOC~~ SOLN
4.0000 [IU] | Freq: Three times a day (TID) | SUBCUTANEOUS | Status: DC
  Administered 2020-06-29 – 2020-07-01 (×6): 4 [IU] via SUBCUTANEOUS
  Filled 2020-06-29 (×6): qty 1

## 2020-06-29 MED ORDER — ENOXAPARIN SODIUM 60 MG/0.6ML ~~LOC~~ SOLN
0.5000 mg/kg | SUBCUTANEOUS | Status: DC
  Administered 2020-06-29 – 2020-07-01 (×4): 52.5 mg via SUBCUTANEOUS
  Filled 2020-06-29 (×4): qty 0.6

## 2020-06-29 NOTE — Progress Notes (Signed)
PROGRESS NOTE    Miguel Costa  OFH:[id number] DOB: 1971/07/28 DOA: 06/27/2020 PCP: Patient, No Pcp Per   Chief Complain: Shortness of breath, fever, diarrhea, vomiting  Brief Narrative: Patient is a 49 year old male with history of diabetes type 2, hypertension, CKD stage IIIa, diagnosed with Covid on 9/30 who presents to the emergency room with complaints of shortness of breath, cough, fever, chills, diarrhea, nausea/vomiting, poor oral intake. His symptoms started on 9/25. On arrival, he was hypoxic on even mild exertion and had increased work of breathing. He was febrile with temperature 100.2, tachycardic. Saturation improved when he was placed on oxygen at 2 to 3 L/min. Chest x-ray showed superimposed patchy pulmonary infiltrate within the left lung. Patient was started for management of Covid pneumonia and is on remdesivir and Decadron.  Assessment & Plan:   Principal Problem:   Acute respiratory failure due to COVID-19 [facility]) Active Problems:   HTN (hypertension)   Gastroenteritis due to COVID-19 virus   Pneumonia due to COVID-19 virus   Type 2 diabetes mellitus without complication (HCC)   Chronic kidney disease, stage 3a (HCC)   Acute hypoxic respiratory failure from Covid pneumonia: Presented with poor appetite, nausea, vomiting, shortness of breath, fever, chills, diarrhea. Chest x-ray as above. Started on remdesivir( day 3) and Decadron. Continue bronchodilators, supplemental oxygen, anticough medications.  He is on 2 L of oxygen per minute this morning.  Try to wean the oxygen. Will follow inflammatory markers. CT of the chest did not show any PE but confirmed multifocal pulmonary infiltrates. Patient is not vaccinated against Covid and he has no plans for vaccination in the future.Marland Kitchen He still has some diarrhea but  he is tolerating diet. Currently afebrile.  Hypertension: Continue monitor blood pressure. Continue lisinopril.  Diabetes type 2 with hyperglycemia: Monitor  blood sugars. Continue current regimen of insulin. Hemoglobin A1c of 6.8  CKD stage IIIa: Currently kidney function at baseline.         DVT prophylaxis: Lovenox Code Status: Full Family Communication: None  Status is: Inpatient  Remains inpatient appropriate because:Inpatient level of care appropriate due to severity of illness   Dispo: The patient is from: Home              Anticipated d/c is to: Home              Anticipated d/c date is:1-2 days              Patient currently is not medically stable to d/c.  If he does not qualify for home oxygen, he might be discharged tomorrow   Consultants: None  Procedures:None  Antimicrobials:  Anti-infectives (From admission, onward)   Start     Dose/Rate Route Frequency Ordered Stop   06/28/20 1000  remdesivir 100 mg in sodium chloride 0.9 % 100 mL IVPB       "Followed by" Linked Group Details   100 mg 200 mL/hr over 30 Minutes Intravenous Daily 06/27/20 2208 07/02/20 0959   06/28/20 1000  remdesivir 100 mg in sodium chloride 0.9 % 100 mL IVPB  Status:  Discontinued       "Followed by" Linked Group Details   100 mg 200 mL/hr over 30 Minutes Intravenous Daily 06/27/20 2354 06/28/20 0332   06/28/20 0000  remdesivir 200 mg in sodium chloride 0.9% 250 mL IVPB  Status:  Discontinued       "Followed by" Linked Group Details   200 mg 580 mL/hr over 30 Minutes Intravenous Once 06/27/20 2354 06/28/20  [id number]   06/27/20 2215  remdesivir 200 mg in sodium chloride 0.9% 250 mL IVPB       "Followed by" Linked Group Details   200 mg 580 mL/hr over 30 Minutes Intravenous Once 06/27/20 2208 06/28/20 0111      Subjective:  Patient seen and examined the bedside this morning.  Hemodynamically stable.  Complains of some diarrhea but denies any abdominal pain.  Feels much better.  Denies any shortness of breath or cough.  On 2 L of oxygen per minute.  Objective: Vitals:   06/28/20 1618 06/28/20 1929 06/28/20 2333 06/29/20 0500  BP: 137/84  (!) 126/96 114/80 (!) 124/94  Pulse: 81 87 76 64  Resp: 18 18 20 20   Temp:  98.6 F (37 C) 98.5 F (36.9 C) 98 F (36.7 C)  TempSrc:  Oral Oral Oral  SpO2: 93% 92% 94% 95%  Weight:      Height:        Intake/Output Summary (Last 24 hours) at 06/29/2020 0744 Last data filed at 06/28/2020 2002 Gross per 24 hour  Intake 240 ml  Output --  Net 240 ml   Filed Weights   06/27/20 2009 06/28/20 0313  Weight: 102.5 kg 105 kg    Examination:  General exam: Appears calm and comfortable ,Not in distress,average built HEENT:PERRL,Oral mucosa moist, Ear/Nose normal on gross exam Respiratory system: Bilateral diminished air sounds but not crackles or wheezes Cardiovascular system: S1 & S2 heard, RRR. No JVD, murmurs, rubs, gallops or clicks. Gastrointestinal system: Abdomen is nondistended, soft and nontender. No organomegaly or masses felt. Normal bowel sounds heard. Central nervous system: Alert and oriented. No focal neurological deficits. Extremities: No edema, no clubbing ,no cyanosis Skin: No rashes, lesions or ulcers,no icterus ,no pallor    Data Reviewed: I have personally reviewed following labs and imaging studies  CBC: Recent Labs  Lab 06/27/20 2023 06/28/20 0330 06/29/20 0517  WBC 6.1 3.2* 7.3  NEUTROABS  --  2.7 6.4  HGB 16.3 14.6 15.2  HCT 46.6 42.0 44.3  MCV 87.4 88.6 89.7  PLT 191 174 208   Basic Metabolic Panel: Recent Labs  Lab 06/27/20 2023 06/28/20 0330 06/29/20 0517  NA 134* 134* 137  K 4.2 4.1 4.6  CL 102 101 103  CO2 22 22 26   GLUCOSE 201* 290* 266*  BUN 20 21* 30*  CREATININE 1.44* 1.35* 1.27*  CALCIUM 8.8* 8.2* 8.8*   GFR: Estimated Creatinine Clearance: 86.8 mL/min (A) (by C-G formula based on SCr of 1.27 mg/dL (H)). Liver Function Tests: Recent Labs  Lab 06/28/20 0330 06/29/20 0517  AST 33 29  ALT 37 36  ALKPHOS 52 49  BILITOT 1.0 0.7  PROT 6.7 6.5  ALBUMIN 3.2* 3.1*   No results for input(s): LIPASE, AMYLASE in the last 168  hours. No results for input(s): AMMONIA in the last 168 hours. Coagulation Profile: No results for input(s): INR, PROTIME in the last 168 hours. Cardiac Enzymes: No results for input(s): CKTOTAL, CKMB, CKMBINDEX, TROPONINI in the last 168 hours. BNP (last 3 results) No results for input(s): PROBNP in the last 8760 hours. HbA1C: Recent Labs    06/28/20 0330  HGBA1C 6.8*   CBG: Recent Labs  Lab 06/28/20 0340 06/28/20 0721 06/28/20 1132 06/28/20 1650 06/28/20 2039  GLUCAP 272* 271* 265* 221* 220*   Lipid Profile: No results for input(s): CHOL, HDL, LDLCALC, TRIG, CHOLHDL, LDLDIRECT in the last 72 hours. Thyroid Function Tests: No results for input(s): TSH, T4TOTAL, FREET4, T3FREE,  THYROIDAB in the last 72 hours. Anemia Panel: Recent Labs    06/28/20 0330 06/29/20 0517  FERRITIN 823* 871*   Sepsis Labs: No results for input(s): PROCALCITON, LATICACIDVEN in the last 168 hours.  No results found for this or any previous visit (from the past 240 hour(s)).       Radiology Studies: CT ANGIO CHEST PE W OR WO CONTRAST  Result Date: 06/28/2020 CLINICAL DATA:  Chest pain positive COVID test EXAM: CT ANGIOGRAPHY CHEST WITH CONTRAST TECHNIQUE: Multidetector CT imaging of the chest was performed using the standard protocol during bolus administration of intravenous contrast. Multiplanar CT image reconstructions and MIPs were obtained to evaluate the vascular anatomy. CONTRAST:  OMNIPAQUE IOHEXOL 350 MG/ML SOLN COMPARISON:  None. FINDINGS: Cardiovascular: There is relatively poor opacification of the pulmonary arterial tree and the examination is capable of excluding only large central pulmonary emboli through the level of the main right and left pulmonary arteries. There is no intraluminal filling defect within the structures. The lobar, segmental, and subsegmental pulmonary arterial tree is not adequately opacified to definitively exclude the presence of pulmonary embolism. The  central pulmonary arteries are of normal caliber. Mild coronary artery calcification. Global cardiac size within normal limits. No pericardial effusion. The thoracic aorta is unremarkable. Mediastinum/Nodes: Thyroid unremarkable. No pathologic thoracic adenopathy. Esophagus unremarkable. Lungs/Pleura: There are moderate bilateral scattered airspace infiltrates, slightly more prevalent within the posterior basal lower lobes bilaterally. The findings are not prototypical but can be seen in acute to subacute COVID-19 pneumonia, though alternative viral pneumonias could result in a similar appearance. No pneumothorax or pleural effusion. No central obstructing mass. Upper Abdomen: No acute abnormality. Musculoskeletal: Multiple healed left rib fractures are noted. No acute bone abnormality. Review of the MIP images confirms the above findings. IMPRESSION: Multifocal pulmonary infiltrates, likely infectious. These can be seen in COVID-19 pneumonia or alternative viral pneumonias. Moderate parenchymal involvement. Technically limited examination for evaluation of pulmonary embolism. No large central pulmonary embolus identified. Electronically Signed   By: Helyn Numbers MD   On: 06/28/2020 03:24   DG Chest Portable 1 View  Result Date: 06/27/2020 CLINICAL DATA:  Chest pain EXAM: PORTABLE CHEST 1 VIEW COMPARISON:  04/10/2019 FINDINGS: Lung volumes are small. Mild left-sided relative volume loss likely related to chest wall deformity. Patchy left perihilar pulmonary infiltrate is present, possibly infectious or inflammatory in the acute setting. No pneumothorax or pleural effusion. Cardiac size is within normal limits. No acute bone abnormality. IMPRESSION: Pulmonary hypoinflation. Superimposed patchy pulmonary infiltrate within the left mid lung zone, possibly infectious or inflammatory in nature. Electronically Signed   By: Helyn Numbers MD   On: 06/27/2020 22:16        Scheduled Meds: . albuterol  2 puff  Inhalation Q6H  . vitamin C  500 mg Oral Daily  . dexamethasone (DECADRON) injection  6 mg Intravenous Q24H  . enoxaparin (LOVENOX) injection  40 mg Subcutaneous Q24H  . insulin aspart  0-15 Units Subcutaneous TID WC  . insulin aspart  0-5 Units Subcutaneous QHS  . insulin detemir  0.1 Units/kg Subcutaneous BID  . linagliptin  5 mg Oral Daily  . multivitamin with minerals  1 tablet Oral Daily  . pneumococcal 23 valent vaccine  0.5 mL Intramuscular Tomorrow-1000  . zinc sulfate  220 mg Oral Daily   Continuous Infusions: . remdesivir 100 mg in NS 100 mL       LOS: 2 days    Time spent: 25 mins,More than 50% of  that time was spent in counseling and/or coordination of care.      Burnadette Pop, MD [facility] P10/07/2020, 7:44 AM

## 2020-06-29 NOTE — Progress Notes (Signed)
Inpatient Diabetes Program Recommendations  AACE/ADA: New Consensus Statement on Inpatient Glycemic Control (2015)  Target Ranges:  Prepandial:   less than 140 mg/dL      Peak postprandial:   less than 180 mg/dL (1-2 hours)      Critically ill patients:  140 - 180 mg/dL   Lab Results  Component Value Date   GLUCAP 238 (H) 06/29/2020   HGBA1C 6.8 (H) 06/28/2020    Review of Glycemic Control Results for Miguel Costa, Miguel Costa (MRN [id number]) as of 06/29/2020 12:42  Ref. Range 06/28/2020 07:21 06/28/2020 11:32 06/28/2020 16:50 06/28/2020 20:39 06/29/2020 07:56  Glucose-Capillary Latest Ref Range: 70 - 99 mg/dL 878 (H) 676 (H) 720 (H) 220 (H) 238 (H)   Diabetes history: DM2 Outpatient Diabetes medications: Glucotrol 5 mg qd Current orders for Inpatient glycemic control: Levemir 11 units bid + Tradjenta 5 qd + Novolog moderate tid + hs 0-5  Inpatient Diabetes Program Recommendations:   -Add Novolog 4 units tid meal coverage if eats 50% while on steroids and Amaryl held. Secure chat sent to Dr. Renford Dills.  Thank you, Billy Fischer. [place] Ice, RN, MSN, CDE  Diabetes Coordinator Inpatient Glycemic Control Team Team Pager [phone] (8am-5pm) 06/29/2020 12:46 PM

## 2020-06-29 NOTE — Progress Notes (Signed)
Initial Nutrition Assessment  DOCUMENTATION CODES:   Obesity unspecified  INTERVENTION:   Double protein with meals  Liberalize diet   MVI daily  NUTRITION DIAGNOSIS:   Increased nutrient needs related to catabolic illness (COVID 19) as evidenced by increased estimated needs.  GOAL:   Patient will meet greater than or equal to 90% of their needs  MONITOR:   PO intake, Labs, Weight trends, Skin, I & O's  REASON FOR ASSESSMENT:   Malnutrition Screening Tool    ASSESSMENT:   49 y.o. male with medical history significant for diabetes and hypertension, CKD 3a, Covid positive since 9/30 but with symptom onset since 9/25, who did not get monoclonal antibodies as he was outside of the 10-day window who presents to the emergency room with worsening shortness of breath and cough, fever and chills, diarrhea with intractable nausea and vomiting  Spoke with pt via phone. Pt reports good appetite and oral intake at baseline but reports that his appetite was poor for 3 days pta r/t nausea and vomiting. Pt reports that his appetite has improved in hospital and that he is eating 100% of his meals. RD discussed with pt the importance of adequate nutrition needed to preserve lean muscle. Pt declines all ONS today as he reports that he used to drink these while trying to body build and that he is sick of them. RD will add double protein to meal trays and liberalize the heart healthy portion of pt's diet as this is restrictive of protein. Pt reports a 25lb weight loss since September. Per chart, pt weighed 254lbs in April, 243lbs in June and 237lbs in August. It appears as though pt has lost 23lbs(9%) over the past 6 months which is not significant for the time frame.     Medications reviewed and include: vitamin C, dexamethasone, lovenox, insulin, MVI, zinc   Labs reviewed: BUN 30(H), creat 1.27(H) cbgs- 238, 291 x 24 hrs AIC 6.8(H)- 10/10  NUTRITION - FOCUSED PHYSICAL EXAM: Unable to perform  at this time   Diet Order:   Diet Order            Diet Carb Modified Fluid consistency: Thin; Room service appropriate? Yes  Diet effective now                EDUCATION NEEDS:   Education needs have been addressed  Skin:  Skin Assessment: Reviewed RN Assessment  Last BM:  10/10- type 7  Height:   Ht Readings from Last 1 Encounters:  06/28/20 5\' 11"  (1.803 m)    Weight:   Wt Readings from Last 1 Encounters:  06/28/20 105 kg    Ideal Body Weight:  78 kg  BMI:  Body mass index is 32.27 kg/m.  Estimated Nutritional Needs:   Kcal:  2300-2600kcal/day  Protein:  115-130g/day  Fluid:  >2.3L/day  08/28/20 MS, RD, LDN Please refer to [facility] for RD and/or RD on-call/weekend/after hours pager

## 2020-06-29 NOTE — Progress Notes (Signed)
PHARMACIST - PHYSICIAN COMMUNICATION  CONCERNING:  Enoxaparin (Lovenox) for DVT Prophylaxis    RECOMMENDATION: Patient was prescribed enoxaprin 40mg  q24 hours for VTE prophylaxis.   Filed Weights   06/27/20 2009 06/28/20 0313  Weight: 102.5 kg (226 lb) 105 kg (231 lb 6.4 oz)    Body mass index is 32.27 kg/m.  Estimated Creatinine Clearance: 86.8 mL/min (A) (by C-G formula based on SCr of 1.27 mg/dL (H)).   Based on [facility] policy patient is candidate for enoxaparin 0.5mg /kg TBW SQ every 24 hours based on BMI being >30.  DESCRIPTION: Pharmacy has adjusted enoxaparin dose per [facility] policy.  Patient is now receiving enoxaparin 52.5mg  every 24hours    [facility], PharmD, BCPS Clinical Pharmacist 06/29/2020 8:12 AM

## 2020-06-30 LAB — GLUCOSE, CAPILLARY
Glucose-Capillary: 251 mg/dL — ABNORMAL HIGH (ref 70–99)
Glucose-Capillary: 332 mg/dL — ABNORMAL HIGH (ref 70–99)
Glucose-Capillary: 386 mg/dL — ABNORMAL HIGH (ref 70–99)
Glucose-Capillary: 311 mg/dL — ABNORMAL HIGH (ref 70–99)

## 2020-06-30 LAB — CBC WITH DIFFERENTIAL/PLATELET
Abs Immature Granulocytes: 0.04 10*3/uL (ref 0.00–0.07)
Basophils Absolute: 0 10*3/uL (ref 0.0–0.1)
Basophils Relative: 0 %
Eosinophils Absolute: 0 10*3/uL (ref 0.0–0.5)
Eosinophils Relative: 0 %
HCT: 42.7 % (ref 39.0–52.0)
Hemoglobin: 14.5 g/dL (ref 13.0–17.0)
Immature Granulocytes: 1 %
Lymphocytes Relative: 7 %
Lymphs Abs: 0.5 10*3/uL — ABNORMAL LOW (ref 0.7–4.0)
MCH: 30.8 pg (ref 26.0–34.0)
MCHC: 34 g/dL (ref 30.0–36.0)
MCV: 90.7 fL (ref 80.0–100.0)
Monocytes Absolute: 0.2 10*3/uL (ref 0.1–1.0)
Monocytes Relative: 3 %
Neutro Abs: 7.2 10*3/uL (ref 1.7–7.7)
Neutrophils Relative %: 89 %
Platelets: 203 10*3/uL (ref 150–400)
RBC: 4.71 MIL/uL (ref 4.22–5.81)
RDW: 12.2 % (ref 11.5–15.5)
WBC: 7.9 10*3/uL (ref 4.0–10.5)
nRBC: 0 % (ref 0.0–0.2)

## 2020-06-30 LAB — COMPREHENSIVE METABOLIC PANEL
ALT: 41 U/L (ref 0–44)
AST: 32 U/L (ref 15–41)
Albumin: 2.9 g/dL — ABNORMAL LOW (ref 3.5–5.0)
Alkaline Phosphatase: 46 U/L (ref 38–126)
Anion gap: 9 (ref 5–15)
BUN: 31 mg/dL — ABNORMAL HIGH (ref 6–20)
CO2: 24 mmol/L (ref 22–32)
Calcium: 8.6 mg/dL — ABNORMAL LOW (ref 8.9–10.3)
Chloride: 103 mmol/L (ref 98–111)
Creatinine, Ser: 1.2 mg/dL (ref 0.61–1.24)
GFR, Estimated: >60 mL/min (ref >60)
Glucose, Bld: 285 mg/dL — ABNORMAL HIGH (ref 70–99)
Potassium: 4.6 mmol/L (ref 3.5–5.1)
Sodium: 136 mmol/L (ref 135–145)
Total Bilirubin: 0.7 mg/dL (ref 0.3–1.2)
Total Protein: 6.3 g/dL — ABNORMAL LOW (ref 6.5–8.1)

## 2020-06-30 LAB — FERRITIN: Ferritin: 753 ng/mL — ABNORMAL HIGH (ref 24–336)

## 2020-06-30 LAB — C-REACTIVE PROTEIN: CRP: 1.6 mg/dL — ABNORMAL HIGH (ref <1.0)

## 2020-06-30 LAB — FIBRIN DERIVATIVES D-DIMER (ARMC ONLY): Fibrin derivatives D-dimer (ARMC): 613.21 ng/mL (FEU) — ABNORMAL HIGH (ref 0.00–499.00)

## 2020-06-30 MED ORDER — INSULIN ASPART 100 UNIT/ML ~~LOC~~ SOLN
0.0000 [IU] | Freq: Every day | SUBCUTANEOUS | Status: DC
  Administered 2020-06-30: 4 [IU] via SUBCUTANEOUS
  Filled 2020-06-30: qty 1

## 2020-06-30 MED ORDER — PREDNISONE 20 MG PO TABS
40.0000 mg | ORAL_TABLET | Freq: Every day | ORAL | Status: DC
  Administered 2020-06-30 – 2020-07-01 (×2): 40 mg via ORAL
  Filled 2020-06-30 (×2): qty 2

## 2020-06-30 MED ORDER — INSULIN ASPART 100 UNIT/ML ~~LOC~~ SOLN
0.0000 [IU] | Freq: Three times a day (TID) | SUBCUTANEOUS | Status: DC
  Administered 2020-06-30: 20 [IU] via SUBCUTANEOUS
  Administered 2020-07-01: 4 [IU] via SUBCUTANEOUS
  Administered 2020-07-01: 11 [IU] via SUBCUTANEOUS
  Filled 2020-06-30 (×3): qty 1

## 2020-06-30 MED ORDER — INSULIN DETEMIR 100 UNIT/ML ~~LOC~~ SOLN
15.0000 [IU] | Freq: Two times a day (BID) | SUBCUTANEOUS | Status: DC
  Administered 2020-06-30 – 2020-07-01 (×2): 15 [IU] via SUBCUTANEOUS
  Filled 2020-06-30 (×4): qty 0.15

## 2020-06-30 NOTE — Plan of Care (Signed)

## 2020-06-30 NOTE — TOC Initial Note (Signed)
Transition of Care [facility]) - Initial/Assessment Note    Patient Details  Name: Miguel Costa MRN: [id number] Date of Birth: 04-Dec-1970  Transition of [facility] CM/SW Contact:    Allayne Butcher, RN Phone Number: 06/30/2020, 12:55 PM  Clinical Narrative:                 Patient admitted to the hospital with COVID requiring acute supplemental oxygen.  RNCM was able to speak with patient via phone.  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  Patient is a diabetic and he feels that manages his diabetes very well, he is followed by endocrinology.  Patient is current with a provider, Dr. Merlinda Frederick at [facility] although he is planning on finding another provider very soon.   Patient will likely need home oxygen at discharge.  Zach with [employer] is aware of oxygen referral.  Potential discharge for tomorrow.   Expected Discharge Plan: Home/Self Care Barriers to Discharge: Continued Medical Work up   Patient Goals and CMS Choice Patient states their goals for this hospitalization and ongoing recovery are:: will be glad to go home tomorrow      Expected Discharge Plan and Services Expected Discharge Plan: Home/Self Care   Discharge Planning Services: CM Consult   Living arrangements for the past 2 months: Single Family Home                 DME Arranged: Oxygen DME Agency: [facility] Date DME Agency Contacted: 06/30/20 Time DME Agency Contacted: [phone] Representative spoke with at DME Agency: Oletha Cruel HH Arranged: Patient Refused HH          Prior Living Arrangements/Services Living arrangements for the past 2 months: Single Family Home Lives with:: Spouse Patient language and need for interpreter reviewed:: Yes Do you feel safe going back to the place where you live?: Yes      Need for Family Participation in Patient Care: Yes (Comment) (COVID) Care giver support system in place?: Yes (comment) (wife)   Criminal  Activity/Legal Involvement Pertinent to Current Situation/Hospitalization: No - Comment as needed  Activities of Daily Living Home Assistive Devices/Equipment: None ADL Screening (condition at time of admission) Patient's cognitive ability adequate to safely complete daily activities?: Yes Is the patient deaf or have difficulty hearing?: No Does the patient have difficulty seeing, even when wearing glasses/contacts?: No Does the patient have difficulty concentrating, remembering, or making decisions?: No Patient able to express need for assistance with ADLs?: Yes Does the patient have difficulty dressing or bathing?: No Independently performs ADLs?: Yes (appropriate for developmental age) Does the patient have difficulty walking or climbing stairs?: No Weakness of Legs: None Weakness of Arms/Hands: None  Permission Sought/Granted Permission sought to share information with : Case Manager, Family Supports, Other (comment) Permission granted to share information with : Yes, Verbal Permission Granted  Share Information with NAME: Duwayne Heck  Permission granted to share info w AGENCY: [employer]  Permission granted to share info w Relationship: wife     Emotional Assessment   Attitude/Demeanor/Rapport: Engaged Affect (typically observed): Accepting Orientation: : Oriented to Self, Oriented to Place, Oriented to  Time, Oriented to Situation Alcohol / Substance Use: Not Applicable Psych Involvement: No (comment)  Admission diagnosis:  Acute respiratory failure due to COVID-19 (HCC) [U07.1, J96.00] Patient Active Problem List   Diagnosis Date Noted  . Chronic kidney disease, stage 3a (HCC) 06/28/2020  . Acute respiratory failure due to COVID-19 (  HCC) 06/27/2020  . Gastroenteritis due to COVID-19 virus 06/27/2020  . Pneumonia due to COVID-19 virus 06/27/2020  . Type 2 diabetes mellitus without complication (HCC) 06/27/2020  . Allergic rhinitis 12/23/2015  . HTN (hypertension) 12/23/2015    PCP:  Patient, No Pcp Per Pharmacy:   [facility] [phone] Dan Humphreys, [place] - [address] [place] [address] Phone: [phone] Fax: [phone]     Social Determinants of Health (SDOH) Interventions    Readmission Risk Interventions No flowsheet data found.

## 2020-06-30 NOTE — Progress Notes (Signed)
PROGRESS NOTE    Miguel Costa  OEH:[id number] DOB: 1971/04/08 DOA: 06/27/2020 PCP: Patient, No Pcp Per   Chief Complain: Shortness of breath, fever, diarrhea, vomiting  Brief Narrative: Patient is a 49 year old male with history of diabetes type 2, hypertension, CKD stage IIIa, diagnosed with Covid on 9/30 who presents to the emergency room with complaints of shortness of breath, cough, fever, chills, diarrhea, nausea/vomiting, poor oral intake. His symptoms started on 9/25. On arrival, he was hypoxic on even mild exertion and had increased work of breathing. He was febrile with temperature 100.2, tachycardic. Saturation improved when he was placed on oxygen at 2 to 3 L/min. Chest x-ray showed superimposed patchy pulmonary infiltrate within the left lung. Patient was started for management of Covid pneumonia and is on remdesivir and Decadron.  Assessment & Plan:   Principal Problem:   Acute respiratory failure due to COVID-19 [facility]) Active Problems:   HTN (hypertension)   Gastroenteritis due to COVID-19 virus   Pneumonia due to COVID-19 virus   Type 2 diabetes mellitus without complication (HCC)   Chronic kidney disease, stage 3a (HCC)   Acute hypoxic respiratory failure from Covid pneumonia: Presented with poor appetite, nausea, vomiting, shortness of breath, fever, chills, diarrhea. Chest x-ray as above. Started on remdesivir( day 4) and Decadron. Continue bronchodilators, supplemental oxygen, anticough medications.  He is on 1 L of oxygen per minute this morning.  We will to wean the oxygen.  On ambulation, he desaturated and continued to require oxygen for maintenance of saturation so discharge canceled.   Will follow inflammatory markers. CT of the chest did not show any PE but confirmed multifocal pulmonary infiltrates. Patient is not vaccinated against Covid and he has no plans for vaccination in the future.. Diarrhea has improved and he is tolerating diet. Currently  afebrile.  Hypertension: Continue monitor blood pressure.  His blood pressure is stable without continuing his home medication.Takes valsartan at home.  Diabetes type 2 with hyperglycemia: Monitor blood sugars. Continue current regimen of insulin and Tradjenta. Hemoglobin A1c of 6.8.  Diabetic coordinator was following.  On glipizide at home.  CKD stage IIIa: Currently kidney function at baseline.  Nutrition Problem: Increased nutrient needs Etiology: catabolic illness (COVID 19)      DVT prophylaxis: Lovenox Code Status: Full Family Communication: Called and discussed with wife today Status is: Inpatient  Remains inpatient appropriate because:Inpatient level of care appropriate due to severity of illness   Dispo: The patient is from: Home              Anticipated d/c is to: Home              Anticipated d/c date is:1-2 days              Patient currently is not medically stable to d/c.  He desaturated on ambulation today requiring 2 L of oxygen.  Will keep him here until tomorrow to finish the course of remdesivir.  If he continues to require oxygen then will plan to send him home tomorrow with home oxygen.   Consultants: None  Procedures:None  Antimicrobials:  Anti-infectives (From admission, onward)   Start     Dose/Rate Route Frequency Ordered Stop   06/28/20 1000  remdesivir 100 mg in sodium chloride 0.9 % 100 mL IVPB       "Followed by" Linked Group Details   100 mg 200 mL/hr over 30 Minutes Intravenous Daily 06/27/20 2208 07/02/20 0959   06/28/20 1000  remdesivir 100 mg in  sodium chloride 0.9 % 100 mL IVPB  Status:  Discontinued       "Followed by" Linked Group Details   100 mg 200 mL/hr over 30 Minutes Intravenous Daily 06/27/20 2354 06/28/20 0332   06/28/20 0000  remdesivir 200 mg in sodium chloride 0.9% 250 mL IVPB  Status:  Discontinued       "Followed by" Linked Group Details   200 mg 580 mL/hr over 30 Minutes Intravenous Once 06/27/20 2354 06/28/20 0332    06/27/20 2215  remdesivir 200 mg in sodium chloride 0.9% 250 mL IVPB       "Followed by" Linked Group Details   200 mg 580 mL/hr over 30 Minutes Intravenous Once 06/27/20 2208 06/28/20 0111      Subjective:  Patient seen and examined the bedside this morning.  Hemodynamically stable.  Feels much better today and was on 1L  of oxygen per minute.  Denies any cough or shortness of breath or diarrhea.  He was a full for discharge but he desaturated on ambulation requiring 2 L of oxygen.  Not planning to finish the course of remdesivir and discharge tomorrow with or without oxygen.  Objective: Vitals:   06/30/20 0445 06/30/20 0751 06/30/20 0835 06/30/20 1154  BP: 111/80 (!) 132/94  130/87  Pulse: 78 79  67  Resp: 19 18  17   Temp: 98 F (36.7 C) 98.4 F (36.9 C)  98.6 F (37 C)  TempSrc: Oral   Oral  SpO2: 97% 95% 94% 91%  Weight:      Height:        Intake/Output Summary (Last 24 hours) at 06/30/2020 1221 Last data filed at 06/30/2020 1029 Gross per 24 hour  Intake 920 ml  Output --  Net 920 ml   Filed Weights   06/27/20 2009 06/28/20 0313  Weight: 102.5 kg 105 kg    Examination:  General exam: Appears calm and comfortable ,Not in distress,average built HEENT:PERRL,Oral mucosa moist, Ear/Nose normal on gross exam Respiratory system: No wheezes or crackles heard  cardiovascular system: S1 & S2 heard, RRR. No JVD, murmurs, rubs, gallops or clicks. Gastrointestinal system: Abdomen is nondistended, soft and nontender. No organomegaly or masses felt. Normal bowel sounds heard. Central nervous system: Alert and oriented. No focal neurological deficits. Extremities: No edema, no clubbing ,no cyanosis Skin: No rashes, lesions or ulcers,no icterus ,no pallor     Data Reviewed: I have personally reviewed following labs and imaging studies  CBC: Recent Labs  Lab 06/27/20 2023 06/28/20 0330 06/29/20 0517 06/30/20 0500  WBC 6.1 3.2* 7.3 7.9  NEUTROABS  --  2.7 6.4 7.2   HGB 16.3 14.6 15.2 14.5  HCT 46.6 42.0 44.3 42.7  MCV 87.4 88.6 89.7 90.7  PLT 191 174 208 203   Basic Metabolic Panel: Recent Labs  Lab 06/27/20 2023 06/28/20 0330 06/29/20 0517 06/30/20 0500  NA 134* 134* 137 136  K 4.2 4.1 4.6 4.6  CL 102 101 103 103  CO2 22 22 26 24   GLUCOSE 201* 290* 266* 285*  BUN 20 21* 30* 31*  CREATININE 1.44* 1.35* 1.27* 1.20  CALCIUM 8.8* 8.2* 8.8* 8.6*   GFR: Estimated Creatinine Clearance: 91.8 mL/min (by C-G formula based on SCr of 1.2 mg/dL). Liver Function Tests: Recent Labs  Lab 06/28/20 0330 06/29/20 0517 06/30/20 0500  AST 33 29 32  ALT 37 36 41  ALKPHOS 52 49 46  BILITOT 1.0 0.7 0.7  PROT 6.7 6.5 6.3*  ALBUMIN 3.2* 3.1* 2.9*  No results for input(s): LIPASE, AMYLASE in the last 168 hours. No results for input(s): AMMONIA in the last 168 hours. Coagulation Profile: No results for input(s): INR, PROTIME in the last 168 hours. Cardiac Enzymes: No results for input(s): CKTOTAL, CKMB, CKMBINDEX, TROPONINI in the last 168 hours. BNP (last 3 results) No results for input(s): PROBNP in the last 8760 hours. HbA1C: Recent Labs    06/28/20 0330  HGBA1C 6.8*   CBG: Recent Labs  Lab 06/29/20 1217 06/29/20 1621 06/29/20 2000 06/30/20 0749 06/30/20 1155  GLUCAP 291* 277* 298* 251* 332*   Lipid Profile: No results for input(s): CHOL, HDL, LDLCALC, TRIG, CHOLHDL, LDLDIRECT in the last 72 hours. Thyroid Function Tests: No results for input(s): TSH, T4TOTAL, FREET4, T3FREE, THYROIDAB in the last 72 hours. Anemia Panel: Recent Labs    06/29/20 0517 06/30/20 0500  FERRITIN 871* 753*   Sepsis Labs: No results for input(s): PROCALCITON, LATICACIDVEN in the last 168 hours.  No results found for this or any previous visit (from the past 240 hour(s)).       Radiology Studies: No results found.      Scheduled Meds: . albuterol  2 puff Inhalation Q6H  . vitamin C  500 mg Oral Daily  . enoxaparin (LOVENOX) injection   0.5 mg/kg Subcutaneous Q24H  . insulin aspart  0-15 Units Subcutaneous TID WC  . insulin aspart  0-5 Units Subcutaneous QHS  . insulin aspart  4 Units Subcutaneous TID WC  . insulin detemir  0.1 Units/kg Subcutaneous BID  . linagliptin  5 mg Oral Daily  . multivitamin with minerals  1 tablet Oral Daily  . pneumococcal 23 valent vaccine  0.5 mL Intramuscular Tomorrow-1000  . predniSONE  40 mg Oral Q breakfast  . zinc sulfate  220 mg Oral Daily   Continuous Infusions: . remdesivir 100 mg in NS 100 mL Stopped (06/30/20 0847)     LOS: 3 days    Time spent: 25 mins,More than 50% of that time was spent in counseling and/or coordination of care.      Burnadette Pop, MD [facility] P10/08/2020, 12:21 PM

## 2020-06-30 NOTE — Progress Notes (Signed)
SATURATION QUALIFICATIONS: (This note is used to comply with regulatory documentation for home oxygen)  Patient Saturations on Room Air at Rest = 89%  Patient Saturations on Room Air while Ambulating = 85%  Patient Saturations on 2 Liters of oxygen while Ambulating = 90%  Please briefly explain why patient needs home oxygen: 

## 2020-06-30 NOTE — Progress Notes (Signed)
Called wife to give daily update, no answer, left message, awaiting return call

## 2020-07-01 DIAGNOSIS — J1282 Pneumonia due to coronavirus disease 2019: Secondary | ICD-10-CM

## 2020-07-01 DIAGNOSIS — A0839 Other viral enteritis: Secondary | ICD-10-CM

## 2020-07-01 DIAGNOSIS — E119 Type 2 diabetes mellitus without complications: Secondary | ICD-10-CM

## 2020-07-01 DIAGNOSIS — I10 Essential (primary) hypertension: Secondary | ICD-10-CM

## 2020-07-01 DIAGNOSIS — N1831 Chronic kidney disease, stage 3a: Secondary | ICD-10-CM

## 2020-07-01 LAB — CBC WITH DIFFERENTIAL/PLATELET
Abs Immature Granulocytes: 0.09 10*3/uL — ABNORMAL HIGH (ref 0.00–0.07)
Basophils Absolute: 0 10*3/uL (ref 0.0–0.1)
Basophils Relative: 0 %
Eosinophils Absolute: 0 10*3/uL (ref 0.0–0.5)
Eosinophils Relative: 0 %
HCT: 43 % (ref 39.0–52.0)
Hemoglobin: 14.8 g/dL (ref 13.0–17.0)
Immature Granulocytes: 1 %
Lymphocytes Relative: 10 %
Lymphs Abs: 1.2 10*3/uL (ref 0.7–4.0)
MCH: 30.6 pg (ref 26.0–34.0)
MCHC: 34.4 g/dL (ref 30.0–36.0)
MCV: 88.8 fL (ref 80.0–100.0)
Monocytes Absolute: 0.6 10*3/uL (ref 0.1–1.0)
Monocytes Relative: 5 %
Neutro Abs: 9.8 10*3/uL — ABNORMAL HIGH (ref 1.7–7.7)
Neutrophils Relative %: 84 %
Platelets: 216 10*3/uL (ref 150–400)
RBC: 4.84 MIL/uL (ref 4.22–5.81)
RDW: 12.2 % (ref 11.5–15.5)
WBC: 11.6 10*3/uL — ABNORMAL HIGH (ref 4.0–10.5)
nRBC: 0 % (ref 0.0–0.2)

## 2020-07-01 LAB — FERRITIN: Ferritin: 673 ng/mL — ABNORMAL HIGH (ref 24–336)

## 2020-07-01 LAB — COMPREHENSIVE METABOLIC PANEL
ALT: 74 U/L — ABNORMAL HIGH (ref 0–44)
AST: 43 U/L — ABNORMAL HIGH (ref 15–41)
Albumin: 3.1 g/dL — ABNORMAL LOW (ref 3.5–5.0)
Alkaline Phosphatase: 51 U/L (ref 38–126)
Anion gap: 8 (ref 5–15)
BUN: 27 mg/dL — ABNORMAL HIGH (ref 6–20)
CO2: 26 mmol/L (ref 22–32)
Calcium: 8.7 mg/dL — ABNORMAL LOW (ref 8.9–10.3)
Chloride: 106 mmol/L (ref 98–111)
Creatinine, Ser: 1.18 mg/dL (ref 0.61–1.24)
GFR, Estimated: >60 mL/min (ref >60)
Glucose, Bld: 195 mg/dL — ABNORMAL HIGH (ref 70–99)
Potassium: 3.8 mmol/L (ref 3.5–5.1)
Sodium: 140 mmol/L (ref 135–145)
Total Bilirubin: 0.9 mg/dL (ref 0.3–1.2)
Total Protein: 6.4 g/dL — ABNORMAL LOW (ref 6.5–8.1)

## 2020-07-01 LAB — C-REACTIVE PROTEIN: CRP: 1 mg/dL — ABNORMAL HIGH (ref <1.0)

## 2020-07-01 LAB — FIBRIN DERIVATIVES D-DIMER (ARMC ONLY): Fibrin derivatives D-dimer (ARMC): 505.31 ng/mL (FEU) — ABNORMAL HIGH (ref 0.00–499.00)

## 2020-07-01 LAB — GLUCOSE, CAPILLARY
Glucose-Capillary: 163 mg/dL — ABNORMAL HIGH (ref 70–99)
Glucose-Capillary: 255 mg/dL — ABNORMAL HIGH (ref 70–99)

## 2020-07-01 MED ORDER — DEXAMETHASONE 6 MG PO TABS: 6.0000 mg | ORAL_TABLET | Freq: Every day | ORAL | 0 refills | Status: AC

## 2020-07-01 NOTE — Progress Notes (Signed)
Home meds returned to pt.

## 2020-07-01 NOTE — TOC Transition Note (Signed)
Transition of Care [facility]) - CM/SW Discharge Note   Patient Details  Name: Miguel Costa MRN: [id number] Date of Birth: June 05, 1971  Transition of Care [facility]) CM/SW Contact:  Allayne Butcher, RN Phone Number: 07/01/2020, 2:09 PM   Clinical Narrative:    Patient is medically cleared for discharge home today.  Patient denies the need for home health services.  Patient will be discharging on home O2 at 2L.  [employer] will provide the oxygen and deliver to the patient's room.  Patient's wife will be picking him up today.    Final next level of care: Home/Self Care Barriers to Discharge: Barriers Resolved   Patient Goals and CMS Choice Patient states their goals for this hospitalization and ongoing recovery are:: will be glad to go home tomorrow      Discharge Placement                       Discharge Plan and Services   Discharge Planning Services: CM Consult            DME Arranged: Oxygen DME Agency: [employer] Date DME Agency Contacted: 07/01/20 Time DME Agency Contacted: [phone] Representative spoke with at DME Agency: [facility] Arranged: Patient Refused HH          Social Determinants of Health (SDOH) Interventions     Readmission Risk Interventions No flowsheet data found.

## 2020-07-01 NOTE — Progress Notes (Signed)
Pt dressed and ready for discharge home, portable o2 via  in place, pt with no complaints, no distress or discomfort noted

## 2020-07-01 NOTE — Discharge Summary (Signed)
Physician Discharge Summary  Miguel Costa WUJ:[id number] DOB: 10-13-1970 DOA: 06/27/2020  PCP: Maurine Minister, PA-C  Admit date: 06/27/2020 Discharge date: 07/01/2020  Admitted From: Home Disposition: Home   Recommendations for Outpatient Follow-up:  1. Follow up with PCP in 1-2 weeks 2. Continue diabetes management. Had hyperglycemia related to covid-19 infection and steroids.  Home Health: None Equipment/Devices: 2L O2 Discharge Condition: Stable CODE STATUS: Full Diet recommendation: Carb-modified  Brief/Interim Summary: Patient is a 49 year old male with history of diabetes type 2, hypertension, CKD stage IIIa, diagnosed with Covid on 9/30 who presents to the emergency room with complaints of shortness of breath, cough, fever, chills, diarrhea, nausea/vomiting, poor oral intake. His symptoms started on 9/25. On arrival, he was hypoxic on even mild exertion and had increased work of breathing. He was febrile with temperature 100.2, tachycardic. Saturation improved when he was placed on oxygen at 2 to 3 L/min. Chest x-ray showed superimposed patchy pulmonary infiltrate within the left lung. Patient was admitted for management of Covid pneumonia and given remdesivir and steroids with improvement. He has completed remdesivir therapy and is stable for discharge with supplemental oxygen.  Discharge Diagnoses:  Principal Problem:   Acute respiratory failure due to COVID-19 [facility]) Active Problems:   HTN (hypertension)   Gastroenteritis due to COVID-19 virus   Pneumonia due to COVID-19 virus   Type 2 diabetes mellitus without complication (HCC)   Chronic kidney disease, stage 3a (HCC)  Acute hypoxemic respiratory failure due to covid-19 pneumonia: SARS-CoV-2 PCR +9/30. - s/p remdesivir x5 doses (confirmed with RN)  - Continue steroids with decadron  daily for 5 more days. - Encourage OOB, IS - Continue airborne, contact precautions for 21 days from positive testing. - Encouraged  to get vaccine.  Hypertension:  - No changes to home regimen.  CKD stage IIIa: Currently kidney function at baseline.  NIDT2DM: With HbA1c 6.8%, indicating good chronic control and plan for short taper of steroids, will not change antihyperglycemic regimen at this time.  - PCP follow up recommended.   Obesity: Estimated body mass index is 32.27 kg/m as calculated from the following:   Height as of this encounter:  (1.803 m).   Weight as of this encounter: 105 kg.  Discharge Instructions Discharge Instructions    Diet Carb Modified   Complete by: As directed    Discharge instructions   Complete by: As directed    You are being discharged from the hospital after treatment for covid-19 infection. You are felt to be stable enough to no longer require inpatient monitoring, testing, and treatment, though you will need to follow the recommendations below: - Continue taking decadron every morning for 5 more days - Continue wearing supplemental oxygen until you are rechecked at your PCP appointment. - Per CDC guidelines, you will need to remain in isolation for 21 days from your first positive covid test. - Follow up with your doctor in the next week via telehealth or seek medical attention right away if your symptoms get WORSE.  - You are still encouraged to get a covid vaccination between 21 days (after isolation period ends) and 90 days (before immunity is thought to wear off).  Directions for you at home:  Wear a facemask You should wear a facemask that covers your nose and mouth when you are in the same room with other people and when you visit a healthcare provider. People who live with or visit you should also wear a facemask while they are in the same  room with you.  Separate yourself from other people in your home As much as possible, you should stay in a different room from other people in your home. Also, you should use a separate bathroom, if available.  Avoid  sharing household items You should not share dishes, drinking glasses, cups, eating utensils, towels, bedding, or other items with other people in your home. After using these items, you should wash them thoroughly with soap and water.  Cover your coughs and sneezes Cover your mouth and nose with a tissue when you cough or sneeze, or you can cough or sneeze into your sleeve. Throw used tissues in a lined trash can, and immediately wash your hands with soap and water for at least 20 seconds or use an alcohol-based hand rub.  Wash your [employer] your hands often and thoroughly with soap and water for at least 20 seconds. You can use an alcohol-based hand sanitizer if soap and water are not available and if your hands are not visibly dirty. Avoid touching your eyes, nose, and mouth with unwashed hands.  Directions for those who live with, or provide care at home for you:  Limit the number of people who have contact with the patient If possible, have only one caregiver for the patient. Other household members should stay in another home or place of residence. If this is not possible, they should stay in another room, or be separated from the patient as much as possible. Use a separate bathroom, if available. Restrict visitors who do not have an essential need to be in the home.  Ensure good ventilation Make sure that shared spaces in the home have good air flow, such as from an air conditioner or an opened window, weather permitting.  Wash your hands often Wash your hands often and thoroughly with soap and water for at least 20 seconds. You can use an alcohol based hand sanitizer if soap and water are not available and if your hands are not visibly dirty. Avoid touching your eyes, nose, and mouth with unwashed hands. Use disposable paper towels to dry your hands. If not available, use dedicated cloth towels and replace them when they become wet.  Wear a facemask and gloves Wear a  disposable facemask at all times in the room and gloves when you touch or have contact with the patient's blood, body fluids, and/or secretions or excretions, such as sweat, saliva, sputum, nasal mucus, vomit, urine, or feces.  Ensure the mask fits over your nose and mouth tightly, and do not touch it during use. Throw out disposable facemasks and gloves after using them. Do not reuse. Wash your hands immediately after removing your facemask and gloves. If your personal clothing becomes contaminated, carefully remove clothing and launder. Wash your hands after handling contaminated clothing. Place all used disposable facemasks, gloves, and other waste in a lined container before disposing them with other household waste. Remove gloves and wash your hands immediately after handling these items.  Do not share dishes, glasses, or other household items with the patient Avoid sharing household items. You should not share dishes, drinking glasses, cups, eating utensils, towels, bedding, or other items with a patient who is confirmed to have, or being evaluated for, COVID-19 infection. After the person uses these items, you should wash them thoroughly with soap and water.  Wash laundry thoroughly Immediately remove and wash clothes or bedding that have blood, body fluids, and/or secretions or excretions, such as sweat, saliva, sputum, nasal mucus, vomit,  urine, or feces, on them. Wear gloves when handling laundry from the patient. Read and follow directions on labels of laundry or clothing items and detergent. In general, wash and dry with the warmest temperatures recommended on the label.  Clean all areas the individual has used often Clean all touchable surfaces, such as counters, tabletops, doorknobs, bathroom fixtures, toilets, phones, keyboards, tablets, and bedside tables, every day. Also, clean any surfaces that may have blood, body fluids, and/or secretions or excretions on them. Wear gloves when  cleaning surfaces the patient has come in contact with. Use a diluted bleach solution (e.g., dilute bleach with 1 part bleach and 10 parts water) or a household disinfectant with a label that says EPA-registered for coronaviruses. To make a bleach solution at home, add 1 tablespoon of bleach to 1 quart (4 cups) of water. For a larger supply, add  cup of bleach to 1 gallon (16 cups) of water. Read labels of cleaning products and follow recommendations provided on product labels. Labels contain instructions for safe and effective use of the cleaning product including precautions you should take when applying the product, such as wearing gloves or eye protection and making sure you have good ventilation during use of the product. Remove gloves and wash hands immediately after cleaning.  Monitor yourself for signs and symptoms of illness Caregivers and household members are considered close contacts, should monitor their health, and will be asked to limit movement outside of the home to the extent possible. Follow the monitoring steps for close contacts listed on the symptom monitoring form.  If you have additional questions, contact your local health department or call the epidemiologist on call at [phone] (available 24/7). This guidance is subject to change. For the most up-to-date guidance from [facility], please refer to their website: [email or web address]   MyChart COVID-19 home monitoring program   Complete by: Jul 01, 2020    Is the patient willing to use the MyChart Mobile App for home monitoring?: Yes     Allergies as of 07/01/2020   No Known Allergies     Medication List    STOP taking these medications   doxycycline 100 MG capsule Commonly known as: VIBRAMYCIN   metFORMIN 500 MG tablet Commonly known as: Glucophage   predniSONE 20 MG tablet Commonly known as: DELTASONE     TAKE these medications   dexamethasone 6 MG  tablet Commonly known as: Decadron Take 1 tablet (6 mg total) by mouth daily for 5 days. Start taking on: July 02, 2020   glipiZIDE 5 MG tablet Commonly known as: GLUCOTROL Take 5 mg by mouth every morning.   ibuprofen 200 MG tablet Commonly known as: ADVIL Take 200-400 mg by mouth every 6 (six) hours as needed for fever or mild pain.   valsartan 160 MG tablet Commonly known as: DIOVAN Take 160 mg by mouth daily.            Durable Medical Equipment  (From admission, onward)         Start     Ordered   06/30/20 1237  For home use only DME oxygen  Once       Question Answer Comment  Length of Need 6 Months   Mode or (Route) Nasal cannula   Liters per Minute 2   Frequency Continuous (stationary and portable oxygen unit needed)   Oxygen delivery system Gas      06/30/20 1237          Follow-up Information  Patrice Paradise, MD Follow up.   Specialty: Physician Assistant Contact information: [phone] [facility] [place] [address] [phone]              No Known Allergies  Consultations:  None  Procedures/Studies: CT ANGIO CHEST PE W OR WO CONTRAST  Result Date: 06/28/2020 CLINICAL DATA:  Chest pain positive COVID test EXAM: CT ANGIOGRAPHY CHEST WITH CONTRAST TECHNIQUE: Multidetector CT imaging of the chest was performed using the standard protocol during bolus administration of intravenous contrast. Multiplanar CT image reconstructions and MIPs were obtained to evaluate the vascular anatomy. CONTRAST:  OMNIPAQUE IOHEXOL 350 MG/ML SOLN COMPARISON:  None. FINDINGS: Cardiovascular: There is relatively poor opacification of the pulmonary arterial tree and the examination is capable of excluding only large central pulmonary emboli through the level of the main right and left pulmonary arteries. There is no intraluminal filling defect within the structures. The lobar, segmental, and subsegmental pulmonary arterial tree  is not adequately opacified to definitively exclude the presence of pulmonary embolism. The central pulmonary arteries are of normal caliber. Mild coronary artery calcification. Global cardiac size within normal limits. No pericardial effusion. The thoracic aorta is unremarkable. Mediastinum/Nodes: Thyroid unremarkable. No pathologic thoracic adenopathy. Esophagus unremarkable. Lungs/Pleura: There are moderate bilateral scattered airspace infiltrates, slightly more prevalent within the posterior basal lower lobes bilaterally. The findings are not prototypical but can be seen in acute to subacute COVID-19 pneumonia, though alternative viral pneumonias could result in a similar appearance. No pneumothorax or pleural effusion. No central obstructing mass. Upper Abdomen: No acute abnormality. Musculoskeletal: Multiple healed left rib fractures are noted. No acute bone abnormality. Review of the MIP images confirms the above findings. IMPRESSION: Multifocal pulmonary infiltrates, likely infectious. These can be seen in COVID-19 pneumonia or alternative viral pneumonias. Moderate parenchymal involvement. Technically limited examination for evaluation of pulmonary embolism. No large central pulmonary embolus identified. Electronically Signed   By: Helyn Numbers MD   On: 06/28/2020 03:24   DG Chest Portable 1 View  Result Date: 06/27/2020 CLINICAL DATA:  Chest pain EXAM: PORTABLE CHEST 1 VIEW COMPARISON:  04/10/2019 FINDINGS: Lung volumes are small. Mild left-sided relative volume loss likely related to chest wall deformity. Patchy left perihilar pulmonary infiltrate is present, possibly infectious or inflammatory in the acute setting. No pneumothorax or pleural effusion. Cardiac size is within normal limits. No acute bone abnormality. IMPRESSION: Pulmonary hypoinflation. Superimposed patchy pulmonary infiltrate within the left mid lung zone, possibly infectious or inflammatory in nature. Electronically Signed   By:  Helyn Numbers MD   On: 06/27/2020 22:16    Subjective: Feels better than yesterday, much better from admission. Had no shortness of breath ambulating around unit, but continues to desaturate qualifying for supplemental oxygen. No chest pain or other complaints.   Discharge Exam: Vitals:   07/01/20 0802 07/01/20 1204  BP: 113/89 121/85  Pulse: 77 89  Resp: 15 19  Temp: 98.3 F (36.8 C) 98.2 F (36.8 C)  SpO2: 94% 93%   General: Pt is alert, awake, not in acute distress Cardiovascular: RRR, S1/S2 +, no rubs, no gallops Respiratory: CTA bilaterally, no wheezing, no rhonchi Abdominal: Soft, NT, ND, bowel sounds + Extremities: No edema, no cyanosis  Labs: BNP (last 3 results) No results for input(s): BNP in the last 8760 hours. Basic Metabolic Panel: Recent Labs  Lab 06/27/20 2023 06/28/20 0330 06/29/20 0517 06/30/20 0500 07/01/20 0313  NA 134* 134* 137 136 140  K 4.2 4.1 4.6 4.6  3.8  CL 102 101 103 103 106  CO2 22 22 26 24 26   GLUCOSE 201* 290* 266* 285* 195*  BUN 20 21* 30* 31* 27*  CREATININE 1.44* 1.35* 1.27* 1.20 1.18  CALCIUM 8.8* 8.2* 8.8* 8.6* 8.7*   Liver Function Tests: Recent Labs  Lab 06/28/20 0330 06/29/20 0517 06/30/20 0500 07/01/20 0313  AST 33 29 32 43*  ALT 37 36 41 74*  ALKPHOS 52 49 46 51  BILITOT 1.0 0.7 0.7 0.9  PROT 6.7 6.5 6.3* 6.4*  ALBUMIN 3.2* 3.1* 2.9* 3.1*   No results for input(s): LIPASE, AMYLASE in the last 168 hours. No results for input(s): AMMONIA in the last 168 hours. CBC: Recent Labs  Lab 06/27/20 2023 06/28/20 0330 06/29/20 0517 06/30/20 0500 07/01/20 0313  WBC 6.1 3.2* 7.3 7.9 11.6*  NEUTROABS  --  2.7 6.4 7.2 9.8*  HGB 16.3 14.6 15.2 14.5 14.8  HCT 46.6 42.0 44.3 42.7 43.0  MCV 87.4 88.6 89.7 90.7 88.8  PLT 191 174 208 203 216   Cardiac Enzymes: No results for input(s): CKTOTAL, CKMB, CKMBINDEX, TROPONINI in the last 168 hours. BNP: Invalid input(s): POCBNP CBG: Recent Labs  Lab 06/30/20 1155  06/30/20 1605 06/30/20 1935 07/01/20 0801 07/01/20 1204  GLUCAP 332* 386* 311* 163* 255*   D-Dimer No results for input(s): DDIMER in the last 72 hours. Hgb A1c No results for input(s): HGBA1C in the last 72 hours. Lipid Profile No results for input(s): CHOL, HDL, LDLCALC, TRIG, CHOLHDL, LDLDIRECT in the last 72 hours. Thyroid function studies No results for input(s): TSH, T4TOTAL, T3FREE, THYROIDAB in the last 72 hours.  Invalid input(s): FREET3 Anemia work up Recent Labs    06/30/20 0500 07/01/20 0313  FERRITIN 753* 673*   Urinalysis    Component Value Date/Time   COLORURINE COLORLESS (A) 01/07/2020 1915   APPEARANCEUR CLEAR (A) 01/07/2020 1915   APPEARANCEUR Clear 01/30/2014 0828   LABSPEC 1.024 01/07/2020 1915   LABSPEC 1.027 01/30/2014 0828   PHURINE 6.0 01/07/2020 1915   GLUCOSEU >=500 (A) 01/07/2020 1915   GLUCOSEU Negative 01/30/2014 0828   HGBUR MODERATE (A) 01/07/2020 1915   BILIRUBINUR NEGATIVE 01/07/2020 1915   BILIRUBINUR Negative 01/30/2014 0828   KETONESUR NEGATIVE 01/07/2020 1915   PROTEINUR NEGATIVE 01/07/2020 1915   NITRITE NEGATIVE 01/07/2020 1915   LEUKOCYTESUR NEGATIVE 01/07/2020 1915   LEUKOCYTESUR Negative 01/30/2014 0828    Microbiology No results found for this or any previous visit (from the past 240 hour(s)).  Time coordinating discharge: Approximately 40 minutes  Tyrone Nineyan B Laurelyn Terrero, MD  [facility] 07/01/2020, 1:03 PM

## 2020-07-01 NOTE — Plan of Care (Signed)

## 2021-06-14 ENCOUNTER — Other Ambulatory Visit: Payer: Self-pay | Admitting: Sports Medicine

## 2021-06-14 DIAGNOSIS — M25461 Effusion, right knee: Secondary | ICD-10-CM

## 2021-06-14 DIAGNOSIS — M76891 Other specified enthesopathies of right lower limb, excluding foot: Secondary | ICD-10-CM

## 2021-06-14 DIAGNOSIS — G8929 Other chronic pain: Secondary | ICD-10-CM

## 2021-08-24 ENCOUNTER — Other Ambulatory Visit: Payer: Self-pay | Admitting: Sports Medicine

## 2021-08-24 DIAGNOSIS — G8929 Other chronic pain: Secondary | ICD-10-CM

## 2021-08-24 DIAGNOSIS — M25561 Pain in right knee: Secondary | ICD-10-CM

## 2021-08-24 DIAGNOSIS — M25461 Effusion, right knee: Secondary | ICD-10-CM

## 2021-08-24 DIAGNOSIS — M76891 Other specified enthesopathies of right lower limb, excluding foot: Secondary | ICD-10-CM

## 2021-08-26 ENCOUNTER — Ambulatory Visit
Admission: RE | Admit: 2021-08-26 | Discharge: 2021-08-26 | Disposition: A | Discharge status: Home / Self Care | Payer: BC Managed Care – PPO | Source: Ambulatory Visit | Attending: Sports Medicine | Admitting: Sports Medicine

## 2021-08-26 DIAGNOSIS — M76891 Other specified enthesopathies of right lower limb, excluding foot: Secondary | ICD-10-CM | POA: Diagnosis present

## 2021-08-26 DIAGNOSIS — M25461 Effusion, right knee: Secondary | ICD-10-CM | POA: Insufficient documentation

## 2021-08-26 DIAGNOSIS — G8929 Other chronic pain: Secondary | ICD-10-CM | POA: Insufficient documentation

## 2021-08-26 DIAGNOSIS — M25561 Pain in right knee: Secondary | ICD-10-CM | POA: Diagnosis present

## 2021-09-03 ENCOUNTER — Other Ambulatory Visit: Payer: Self-pay | Admitting: Family Medicine

## 2021-09-03 DIAGNOSIS — F419 Anxiety disorder, unspecified: Secondary | ICD-10-CM

## 2021-09-03 DIAGNOSIS — R413 Other amnesia: Secondary | ICD-10-CM

## 2021-09-09 ENCOUNTER — Other Ambulatory Visit: Payer: Self-pay

## 2021-09-09 ENCOUNTER — Ambulatory Visit
Admission: RE | Admit: 2021-09-09 | Discharge: 2021-09-09 | Disposition: A | Discharge status: Home / Self Care | Payer: BC Managed Care – PPO | Source: Ambulatory Visit | Attending: Family Medicine | Admitting: Family Medicine

## 2021-09-09 DIAGNOSIS — R413 Other amnesia: Secondary | ICD-10-CM

## 2021-09-09 DIAGNOSIS — F419 Anxiety disorder, unspecified: Secondary | ICD-10-CM

## 2023-11-25 ENCOUNTER — Encounter: Payer: Self-pay | Admitting: Emergency Medicine

## 2023-11-25 ENCOUNTER — Ambulatory Visit
Admission: EM | Admit: 2023-11-25 | Discharge: 2023-11-25 | Disposition: A | Discharge status: Home / Self Care | Attending: Family Medicine | Admitting: Family Medicine

## 2023-11-25 DIAGNOSIS — J101 Influenza due to other identified influenza virus with other respiratory manifestations: Secondary | ICD-10-CM | POA: Insufficient documentation

## 2023-11-25 LAB — RESP PANEL BY RT-PCR (FLU A&B, COVID) ARPGX2
Influenza A by PCR: POSITIVE — AB
Influenza B by PCR: NEGATIVE
SARS Coronavirus 2 by RT PCR: NEGATIVE

## 2023-11-25 MED ORDER — OSELTAMIVIR PHOSPHATE 75 MG PO CAPS: 75.0000 mg | ORAL_CAPSULE | Freq: Two times a day (BID) | ORAL | 0 refills | Status: AC

## 2023-11-25 MED ORDER — ONDANSETRON 4 MG PO TBDP: 4.0000 mg | ORAL_TABLET | Freq: Three times a day (TID) | ORAL | 0 refills | Status: AC | PRN

## 2023-11-25 NOTE — ED Triage Notes (Signed)
 Patient c/o cough, bodyaches, and headaches that started yesterday. Patient reports low grade fever this morning.

## 2023-11-25 NOTE — ED Provider Notes (Signed)
 [facility]    CSN: [id number] Arrival date & time: 11/25/23  1111      History   Chief Complaint No chief complaint on file.   HPI Miguel Costa is a 53 y.o. male.   HPI  History obtained from the patient. Miguel Costa presents for body aches, headache, cough, sneezing and low grade fevers that started yesterday.  Tmax 100.2 F.  Thought it was allergies but his symptoms got worse.  Took ibuprofen at 7 AM this morning.  No nausea, vomiting and diarrhea.  Endorses sore throat and nasal congestion.    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    No history of asthma. Denies vaping and smoking.      Past Medical History:  Diagnosis Date   Hypertension     Patient Active Problem List   Diagnosis Date Noted   Chronic kidney disease, stage 3a (HCC) 06/28/2020   Acute respiratory failure due to COVID-19 (HCC) 06/27/2020   Gastroenteritis due to COVID-19 virus 06/27/2020   Pneumonia due to COVID-19 virus 06/27/2020   Type 2 diabetes mellitus without complication (HCC) 06/27/2020   Allergic rhinitis 12/23/2015   HTN (hypertension) 12/23/2015    Past Surgical History:  Procedure Laterality Date   BACK SURGERY     LUMBAR LAMINECTOMY     multiple   NASAL SEPTOPLASTY W/ TURBINOPLASTY     NOSE SURGERY         Home Medications    Prior to Admission medications   Medication Sig Start Date End Date Taking? Authorizing Provider  glipiZIDE (GLUCOTROL) 5 MG tablet Take 5 mg by mouth every morning. 05/08/20   [provider]  ibuprofen (ADVIL) 200 MG tablet Take 200-400 mg by mouth every 6 (six) hours as needed for fever or mild pain.     [provider]  valsartan (DIOVAN) 160 MG tablet Take 160 mg by mouth daily.  03/14/18 06/28/20  [provider]    Family History No family history on file.  Social History Social History   Tobacco Use   Smoking status: Never   Smokeless tobacco:  Never  Vaping Use   Vaping status: Never Used  Substance Use Topics   Alcohol use: Yes    Alcohol/week: 0.0 standard drinks of alcohol    Comment: rarely   Drug use: Never     Allergies   Patient has no known allergies.   Review of Systems Review of Systems: negative unless otherwise stated in HPI.      Physical Exam Triage Vital Signs ED Triage Vitals  Encounter Vitals Group     BP      Systolic BP Percentile      Diastolic BP Percentile      Pulse      Resp      Temp      Temp src      SpO2      Weight      Height      Head Circumference      Peak Flow      Pain Score      Pain Loc      Pain Education      Exclude from Growth Chart    No data found.  Updated Vital Signs There were no vitals taken for this visit.  Visual Acuity Right Eye Distance:   Left Eye Distance:   Bilateral Distance:    Right Eye Near:   Left  Eye Near:    Bilateral Near:     Physical Exam GEN:     alert, non-toxic appearing male in no distress ***   HENT:  mucus membranes moist, oropharyngeal ***without lesions or ***erythema, no*** tonsillar hypertrophy or exudates, *** moderate erythematous edematous turbinates, ***clear nasal discharge, ***bilateral TM normal EYES:   pupils equal and reactive, ***no scleral injection or discharge NECK:  normal ROM, no ***lymphadenopathy, ***no meningismus   RESP:  no increased work of breathing, ***clear to auscultation bilaterally CVS:   regular rate ***and rhythm Skin:   warm and dry, no rash on visible skin***    UC Treatments / Results  Labs (all labs ordered are listed, but only abnormal results are displayed) Labs Reviewed  RESP PANEL BY RT-PCR (FLU A&B, COVID) [id number]    EKG   Radiology No results found.  Procedures Procedures (including critical care time)  Medications Ordered in UC Medications - No data to display  Initial Impression / Assessment and Plan / UC Course  I have reviewed the triage vital signs and the  nursing notes.  Pertinent labs & imaging results that were available during my care of the patient were reviewed by me and considered in my medical decision making (see chart for details).       Pt is a 53 y.o. male who presents for *** days of respiratory symptoms. [name] is ***afebrile here without recent antipyretics. Satting well on room air. Overall pt is ***non-toxic appearing, well hydrated, without respiratory distress. Pulmonary exam ***is unremarkable.  COVID and influenza panel obtained ***and was negative. ***Pt to quarantine until COVID test results or longer if positive.  I will call patient with test results, if positive. History consistent with ***viral respiratory illness. Discussed symptomatic treatment.  Explained lack of efficacy of antibiotics in viral disease.  Typical duration of symptoms discussed.   Return and ED precautions given and voiced understanding. Discussed MDM, treatment plan and plan for follow-up with patient*** who agrees with plan.     Final Clinical Impressions(s) / UC Diagnoses   Final diagnoses:  None   Discharge Instructions   None    ED Prescriptions   None    PDMP not reviewed this encounter.

## 2023-11-25 NOTE — Discharge Instructions (Signed)
 You have influenza A.  Tamiflu wasprescribed.  Your symptoms will gradually improve over the next 7 to 10 days.  The cough may last about 3 weeks.   Take ibuprofen 600 mg with Tylenol 1000 mg for fever, headache or body aches.   For cough: You can also use guaifenesin and dextromethorphan for cough. You can use a humidifier for chest congestion and cough.  If you don't have a humidifier, you can sit in the bathroom with the hot shower running.      For sore throat: try warm salt water gargles, Mucinex sore throat cough drops or cepacol lozenges, throat spray, warm tea or water with lemon/honey, popsicles or ice, or OTC cold relief medicine for throat discomfort. You can also purchase chloraseptic spray at the pharmacy or dollar store.   For congestion: take a daily anti-histamine like Zyrtec, Claritin, and a oral decongestant, such as pseudoephedrine.  You can also use Flonase 1-2 sprays in each nostril daily. Afrin is also a good option, if you do not have high blood pressure.    It is important to stay hydrated: drink plenty of fluids (water, gatorade/powerade/pedialyte, juices, or teas) to keep your throat moisturized and help further relieve irritation/discomfort.    Return or go to the Emergency Department if symptoms worsen or do not improve in the next few days

## 2024-03-26 ENCOUNTER — Other Ambulatory Visit
Admission: RE | Admit: 2024-03-26 | Discharge: 2024-03-26 | Disposition: A | Discharge status: Home / Self Care | Payer: Self-pay | Source: Ambulatory Visit | Attending: Family Medicine | Admitting: Family Medicine

## 2024-03-26 DIAGNOSIS — R42 Dizziness and giddiness: Secondary | ICD-10-CM | POA: Insufficient documentation

## 2024-03-26 DIAGNOSIS — M542 Cervicalgia: Secondary | ICD-10-CM | POA: Diagnosis present

## 2024-03-26 DIAGNOSIS — R5383 Other fatigue: Secondary | ICD-10-CM | POA: Insufficient documentation

## 2024-03-26 LAB — TROPONIN I (HIGH SENSITIVITY): Troponin I (High Sensitivity): 8 ng/L (ref <18)
